# Patient Record
Sex: Female | Born: 1935 | Race: Black or African American | Hispanic: No | Smoking: Former smoker
Health system: Southern US, Community
[De-identification: ages and names within clinical notes are randomized; demographics above are authoritative.]

## PROBLEM LIST (undated history)

## (undated) DIAGNOSIS — C20 Malignant neoplasm of rectum: Secondary | ICD-10-CM

## (undated) DIAGNOSIS — I82409 Acute embolism and thrombosis of unspecified deep veins of unspecified lower extremity: Secondary | ICD-10-CM

## (undated) DIAGNOSIS — D069 Carcinoma in situ of cervix, unspecified: Secondary | ICD-10-CM

## (undated) DIAGNOSIS — M8580 Other specified disorders of bone density and structure, unspecified site: Secondary | ICD-10-CM

## (undated) DIAGNOSIS — I10 Essential (primary) hypertension: Secondary | ICD-10-CM

## (undated) DIAGNOSIS — F32A Depression, unspecified: Secondary | ICD-10-CM

## (undated) DIAGNOSIS — D219 Benign neoplasm of connective and other soft tissue, unspecified: Secondary | ICD-10-CM

## (undated) DIAGNOSIS — F329 Major depressive disorder, single episode, unspecified: Secondary | ICD-10-CM

## (undated) HISTORY — DX: Depression, unspecified: F32.A

## (undated) HISTORY — DX: Benign neoplasm of connective and other soft tissue, unspecified: D21.9

## (undated) HISTORY — PX: TUBAL LIGATION: SHX77

## (undated) HISTORY — PX: OTHER SURGICAL HISTORY: SHX169

## (undated) HISTORY — DX: Other specified disorders of bone density and structure, unspecified site: M85.80

## (undated) HISTORY — PX: ROTATOR CUFF REPAIR: SHX139

## (undated) HISTORY — DX: Carcinoma in situ of cervix, unspecified: D06.9

## (undated) HISTORY — DX: Acute embolism and thrombosis of unspecified deep veins of unspecified lower extremity: I82.409

## (undated) HISTORY — DX: Essential (primary) hypertension: I10

## (undated) HISTORY — PX: COLPOSCOPY: SHX161

## (undated) HISTORY — PX: GYNECOLOGIC CRYOSURGERY: SHX857

## (undated) HISTORY — DX: Major depressive disorder, single episode, unspecified: F32.9

## (undated) HISTORY — PX: VAGINAL HYSTERECTOMY: SUR661

## (undated) HISTORY — PX: CERVICAL CONE BIOPSY: SUR198

## (undated) HISTORY — DX: Malignant neoplasm of rectum: C20

---

## 1998-11-01 ENCOUNTER — Other Ambulatory Visit: Admission: RE | Admit: 1998-11-01 | Discharge: 1998-11-01 | Payer: Self-pay | Admitting: *Deleted

## 1999-10-16 HISTORY — PX: PELVIC LAPAROSCOPY: SHX162

## 1999-10-16 HISTORY — PX: OOPHORECTOMY: SHX86

## 2000-06-25 ENCOUNTER — Other Ambulatory Visit: Admission: RE | Admit: 2000-06-25 | Discharge: 2000-06-25 | Payer: Self-pay | Admitting: *Deleted

## 2000-08-21 ENCOUNTER — Encounter: Admission: RE | Admit: 2000-08-21 | Discharge: 2000-08-21 | Payer: Self-pay | Admitting: *Deleted

## 2000-08-21 ENCOUNTER — Encounter: Payer: Self-pay | Admitting: *Deleted

## 2000-10-04 ENCOUNTER — Encounter: Payer: Self-pay | Admitting: Obstetrics and Gynecology

## 2000-10-11 ENCOUNTER — Observation Stay (HOSPITAL_COMMUNITY): Admission: RE | Admit: 2000-10-11 | Discharge: 2000-10-12 | Payer: Self-pay | Admitting: Obstetrics and Gynecology

## 2000-10-11 ENCOUNTER — Encounter (INDEPENDENT_AMBULATORY_CARE_PROVIDER_SITE_OTHER): Payer: Self-pay | Admitting: Specialist

## 2001-06-05 ENCOUNTER — Ambulatory Visit (HOSPITAL_COMMUNITY): Admission: RE | Admit: 2001-06-05 | Discharge: 2001-06-05 | Payer: Self-pay | Admitting: *Deleted

## 2001-06-13 ENCOUNTER — Encounter: Payer: Self-pay | Admitting: Family Medicine

## 2001-06-13 ENCOUNTER — Ambulatory Visit (HOSPITAL_COMMUNITY): Admission: RE | Admit: 2001-06-13 | Discharge: 2001-06-13 | Payer: Self-pay | Admitting: Family Medicine

## 2001-06-13 ENCOUNTER — Inpatient Hospital Stay (HOSPITAL_COMMUNITY): Admission: EM | Admit: 2001-06-13 | Discharge: 2001-06-17 | Payer: Self-pay | Admitting: Family Medicine

## 2001-07-01 ENCOUNTER — Other Ambulatory Visit: Admission: RE | Admit: 2001-07-01 | Discharge: 2001-07-01 | Payer: Self-pay | Admitting: *Deleted

## 2002-02-11 ENCOUNTER — Ambulatory Visit (HOSPITAL_COMMUNITY): Admission: RE | Admit: 2002-02-11 | Discharge: 2002-02-11 | Payer: Self-pay | Admitting: *Deleted

## 2002-04-08 ENCOUNTER — Encounter (INDEPENDENT_AMBULATORY_CARE_PROVIDER_SITE_OTHER): Payer: Self-pay

## 2002-04-08 ENCOUNTER — Ambulatory Visit (HOSPITAL_COMMUNITY): Admission: RE | Admit: 2002-04-08 | Discharge: 2002-04-08 | Payer: Self-pay | Admitting: Gastroenterology

## 2002-05-07 ENCOUNTER — Ambulatory Visit (HOSPITAL_COMMUNITY): Admission: RE | Admit: 2002-05-07 | Discharge: 2002-05-07 | Payer: Self-pay | Admitting: Gastroenterology

## 2002-05-15 DIAGNOSIS — C20 Malignant neoplasm of rectum: Secondary | ICD-10-CM

## 2002-05-15 HISTORY — DX: Malignant neoplasm of rectum: C20

## 2002-05-21 ENCOUNTER — Encounter (INDEPENDENT_AMBULATORY_CARE_PROVIDER_SITE_OTHER): Payer: Self-pay

## 2002-05-21 ENCOUNTER — Encounter: Payer: Self-pay | Admitting: General Surgery

## 2002-05-21 ENCOUNTER — Ambulatory Visit (HOSPITAL_COMMUNITY): Admission: RE | Admit: 2002-05-21 | Discharge: 2002-05-21 | Payer: Self-pay | Admitting: Surgery

## 2002-05-25 ENCOUNTER — Ambulatory Visit (HOSPITAL_COMMUNITY): Admission: RE | Admit: 2002-05-25 | Discharge: 2002-05-25 | Payer: Self-pay | Admitting: General Surgery

## 2002-05-26 ENCOUNTER — Encounter: Payer: Self-pay | Admitting: Emergency Medicine

## 2002-05-26 ENCOUNTER — Emergency Department (HOSPITAL_COMMUNITY): Admission: EM | Admit: 2002-05-26 | Discharge: 2002-05-26 | Payer: Self-pay | Admitting: Emergency Medicine

## 2002-06-09 ENCOUNTER — Ambulatory Visit (HOSPITAL_COMMUNITY): Admission: RE | Admit: 2002-06-09 | Discharge: 2002-06-09 | Payer: Self-pay | Admitting: Orthopedic Surgery

## 2002-06-19 ENCOUNTER — Encounter: Payer: Self-pay | Admitting: General Surgery

## 2002-06-24 ENCOUNTER — Inpatient Hospital Stay (HOSPITAL_COMMUNITY): Admission: RE | Admit: 2002-06-24 | Discharge: 2002-06-25 | Payer: Self-pay | Admitting: General Surgery

## 2002-08-24 ENCOUNTER — Ambulatory Visit (HOSPITAL_COMMUNITY): Admission: RE | Admit: 2002-08-24 | Discharge: 2002-08-24 | Payer: Self-pay | Admitting: General Surgery

## 2002-08-25 ENCOUNTER — Encounter: Payer: Self-pay | Admitting: General Surgery

## 2002-08-25 ENCOUNTER — Ambulatory Visit (HOSPITAL_COMMUNITY): Admission: RE | Admit: 2002-08-25 | Discharge: 2002-08-25 | Payer: Self-pay | Admitting: General Surgery

## 2003-07-13 ENCOUNTER — Other Ambulatory Visit: Admission: RE | Admit: 2003-07-13 | Discharge: 2003-07-13 | Payer: Self-pay | Admitting: Obstetrics and Gynecology

## 2004-09-12 ENCOUNTER — Other Ambulatory Visit: Admission: RE | Admit: 2004-09-12 | Discharge: 2004-09-12 | Payer: Self-pay | Admitting: Obstetrics and Gynecology

## 2004-11-06 ENCOUNTER — Ambulatory Visit: Payer: Self-pay | Admitting: Hematology & Oncology

## 2005-05-04 ENCOUNTER — Ambulatory Visit: Payer: Self-pay | Admitting: Hematology & Oncology

## 2005-11-02 ENCOUNTER — Ambulatory Visit: Payer: Self-pay | Admitting: Hematology & Oncology

## 2006-02-15 ENCOUNTER — Other Ambulatory Visit: Admission: RE | Admit: 2006-02-15 | Discharge: 2006-02-15 | Payer: Self-pay | Admitting: Obstetrics and Gynecology

## 2006-05-01 ENCOUNTER — Ambulatory Visit: Payer: Self-pay | Admitting: Hematology & Oncology

## 2006-05-06 LAB — CBC WITH DIFFERENTIAL/PLATELET
BASO%: 0.6 % (ref 0.0–2.0)
Eosinophils Absolute: 0.2 10*3/uL (ref 0.0–0.5)
HCT: 36.3 % (ref 34.8–46.6)
LYMPH%: 32.5 % (ref 14.0–48.0)
MCHC: 33.3 g/dL (ref 32.0–36.0)
MCV: 81.7 fL (ref 81.0–101.0)
MONO#: 0.7 10*3/uL (ref 0.1–0.9)
MONO%: 14.7 % — ABNORMAL HIGH (ref 0.0–13.0)
NEUT%: 48.5 % (ref 39.6–76.8)
Platelets: 251 10*3/uL (ref 145–400)
WBC: 4.6 10*3/uL (ref 3.9–10.0)

## 2006-05-06 LAB — COMPREHENSIVE METABOLIC PANEL
Alkaline Phosphatase: 42 U/L (ref 39–117)
CO2: 31 mEq/L (ref 19–32)
Creatinine, Ser: 0.86 mg/dL (ref 0.40–1.20)
Glucose, Bld: 93 mg/dL (ref 70–99)
Total Bilirubin: 0.4 mg/dL (ref 0.3–1.2)

## 2006-05-09 LAB — 5 HIAA, QUANTITATIVE, URINE, 24 HOUR
5-HIAA, 24 Hr Urine: 1 mg/d (ref 0–15)
5-HIAA, Ur: 0.6 mg/L
Creatinine, Urine mg/day: 504 mg/d (ref 500–1400)
Creatinine, Urine-mg/dL-5HIAA: 21 mg/dL
Interpretation: NORMAL
Time-5HIAA: 24 h
Volume, Urine-5HIAA: 2400 mL

## 2006-10-30 ENCOUNTER — Ambulatory Visit: Payer: Self-pay | Admitting: Hematology & Oncology

## 2006-11-04 LAB — CBC WITH DIFFERENTIAL/PLATELET
BASO%: 0.5 % (ref 0.0–2.0)
Basophils Absolute: 0 10*3/uL (ref 0.0–0.1)
EOS%: 1 % (ref 0.0–7.0)
HGB: 11.8 g/dL (ref 11.6–15.9)
MCH: 26.6 pg (ref 26.0–34.0)
MCHC: 32.3 g/dL (ref 32.0–36.0)
MCV: 82.4 fL (ref 81.0–101.0)
MONO%: 12.8 % (ref 0.0–13.0)
RBC: 4.42 10*6/uL (ref 3.70–5.32)
RDW: 15.8 % — ABNORMAL HIGH (ref 11.3–14.5)
lymph#: 1.8 10*3/uL (ref 0.9–3.3)

## 2006-11-04 LAB — COMPREHENSIVE METABOLIC PANEL
ALT: 10 U/L (ref 0–35)
AST: 16 U/L (ref 0–37)
Albumin: 4.1 g/dL (ref 3.5–5.2)
Alkaline Phosphatase: 40 U/L (ref 39–117)
BUN: 15 mg/dL (ref 6–23)
Chloride: 100 mEq/L (ref 96–112)
Potassium: 3.3 mEq/L — ABNORMAL LOW (ref 3.5–5.3)

## 2007-04-30 ENCOUNTER — Ambulatory Visit: Payer: Self-pay | Admitting: Hematology & Oncology

## 2007-05-05 LAB — CBC WITH DIFFERENTIAL/PLATELET
Eosinophils Absolute: 0.1 10*3/uL (ref 0.0–0.5)
HCT: 33.6 % — ABNORMAL LOW (ref 34.8–46.6)
LYMPH%: 26.1 % (ref 14.0–48.0)
MCHC: 33.6 g/dL (ref 32.0–36.0)
MCV: 80.3 fL — ABNORMAL LOW (ref 81.0–101.0)
MONO#: 0.5 10*3/uL (ref 0.1–0.9)
MONO%: 11.7 % (ref 0.0–13.0)
NEUT#: 2.6 10*3/uL (ref 1.5–6.5)
NEUT%: 60.1 % (ref 39.6–76.8)
Platelets: 248 10*3/uL (ref 145–400)
WBC: 4.3 10*3/uL (ref 3.9–10.0)

## 2007-05-05 LAB — COMPREHENSIVE METABOLIC PANEL
BUN: 13 mg/dL (ref 6–23)
CO2: 27 mEq/L (ref 19–32)
Creatinine, Ser: 0.79 mg/dL (ref 0.40–1.20)
Glucose, Bld: 85 mg/dL (ref 70–99)
Total Bilirubin: 0.3 mg/dL (ref 0.3–1.2)

## 2007-05-07 LAB — 5 HIAA, QUANTITATIVE, URINE, 24 HOUR: 5-HIAA, 24 Hr Urine: 3.3 mg/24 h (ref <=6.0)

## 2007-10-31 ENCOUNTER — Ambulatory Visit: Payer: Self-pay | Admitting: Hematology & Oncology

## 2007-12-16 ENCOUNTER — Encounter (INDEPENDENT_AMBULATORY_CARE_PROVIDER_SITE_OTHER): Payer: Self-pay | Admitting: Orthopedic Surgery

## 2007-12-16 ENCOUNTER — Ambulatory Visit (HOSPITAL_BASED_OUTPATIENT_CLINIC_OR_DEPARTMENT_OTHER): Admission: RE | Admit: 2007-12-16 | Discharge: 2007-12-16 | Payer: Self-pay | Admitting: Orthopedic Surgery

## 2008-02-20 ENCOUNTER — Ambulatory Visit: Payer: Self-pay | Admitting: Hematology & Oncology

## 2008-02-24 LAB — COMPREHENSIVE METABOLIC PANEL
ALT: 15 U/L (ref 0–35)
AST: 16 U/L (ref 0–37)
CO2: 30 mEq/L (ref 19–32)
Calcium: 9.7 mg/dL (ref 8.4–10.5)
Chloride: 101 mEq/L (ref 96–112)
Creatinine, Ser: 0.83 mg/dL (ref 0.40–1.20)
Potassium: 3.4 mEq/L — ABNORMAL LOW (ref 3.5–5.3)
Sodium: 141 mEq/L (ref 135–145)
Total Protein: 7 g/dL (ref 6.0–8.3)

## 2008-02-24 LAB — CBC WITH DIFFERENTIAL/PLATELET
BASO%: 0.6 % (ref 0.0–2.0)
EOS%: 3 % (ref 0.0–7.0)
HCT: 35.3 % (ref 34.8–46.6)
MCH: 27.5 pg (ref 26.0–34.0)
MCHC: 33.9 g/dL (ref 32.0–36.0)
MONO#: 0.6 10*3/uL (ref 0.1–0.9)
NEUT%: 50.1 % (ref 39.6–76.8)
RBC: 4.35 10*6/uL (ref 3.70–5.32)
RDW: 15.4 % — ABNORMAL HIGH (ref 11.3–14.5)
WBC: 4.5 10*3/uL (ref 3.9–10.0)
lymph#: 1.5 10*3/uL (ref 0.9–3.3)

## 2008-02-24 LAB — FERRITIN: Ferritin: 55 ng/mL (ref 10–291)

## 2008-08-25 ENCOUNTER — Ambulatory Visit: Payer: Self-pay | Admitting: Hematology & Oncology

## 2008-09-01 ENCOUNTER — Ambulatory Visit: Payer: Self-pay | Admitting: Obstetrics and Gynecology

## 2008-09-01 ENCOUNTER — Other Ambulatory Visit: Admission: RE | Admit: 2008-09-01 | Discharge: 2008-09-01 | Payer: Self-pay | Admitting: Obstetrics and Gynecology

## 2008-09-01 ENCOUNTER — Encounter: Payer: Self-pay | Admitting: Obstetrics and Gynecology

## 2008-10-04 ENCOUNTER — Ambulatory Visit: Payer: Self-pay | Admitting: Obstetrics and Gynecology

## 2008-12-10 ENCOUNTER — Ambulatory Visit: Payer: Self-pay | Admitting: Hematology & Oncology

## 2008-12-14 ENCOUNTER — Ambulatory Visit (HOSPITAL_COMMUNITY): Admission: RE | Admit: 2008-12-14 | Discharge: 2008-12-14 | Payer: Self-pay | Admitting: Oncology

## 2008-12-14 LAB — CBC WITH DIFFERENTIAL/PLATELET
BASO%: 0.5 % (ref 0.0–2.0)
EOS%: 1.2 % (ref 0.0–7.0)
HCT: 36.7 % (ref 34.8–46.6)
HGB: 12.4 g/dL (ref 11.6–15.9)
MCHC: 33.8 g/dL (ref 31.5–36.0)
MONO#: 0.7 10*3/uL (ref 0.1–0.9)
NEUT%: 55.9 % (ref 38.4–76.8)
RDW: 16 % — ABNORMAL HIGH (ref 11.2–14.5)
WBC: 5.6 10*3/uL (ref 3.9–10.3)
lymph#: 1.6 10*3/uL (ref 0.9–3.3)

## 2008-12-14 LAB — COMPREHENSIVE METABOLIC PANEL
ALT: 14 U/L (ref 0–35)
AST: 18 U/L (ref 0–37)
Albumin: 3.7 g/dL (ref 3.5–5.2)
CO2: 32 mEq/L (ref 19–32)
Calcium: 9.5 mg/dL (ref 8.4–10.5)
Chloride: 96 mEq/L (ref 96–112)
Creatinine, Ser: 0.85 mg/dL (ref 0.40–1.20)
Potassium: 3 mEq/L — ABNORMAL LOW (ref 3.5–5.3)
Total Protein: 7.3 g/dL (ref 6.0–8.3)

## 2009-01-18 LAB — 5 HIAA, QUANTITATIVE, URINE, 24 HOUR
5-HIAA, 24 Hr Urine: 2.4 mg/24 h (ref <=6.0)
Volume, Urine-5HIAA: 2700 mL/24 h

## 2009-06-17 ENCOUNTER — Ambulatory Visit: Payer: Self-pay | Admitting: Oncology

## 2009-06-22 LAB — COMPREHENSIVE METABOLIC PANEL
Alkaline Phosphatase: 51 U/L (ref 39–117)
BUN: 17 mg/dL (ref 6–23)
CO2: 30 mEq/L (ref 19–32)
Glucose, Bld: 85 mg/dL (ref 70–99)
Total Bilirubin: 0.4 mg/dL (ref 0.3–1.2)

## 2009-06-22 LAB — CBC WITH DIFFERENTIAL/PLATELET
Basophils Absolute: 0 10*3/uL (ref 0.0–0.1)
Eosinophils Absolute: 0.1 10*3/uL (ref 0.0–0.5)
HCT: 35.8 % (ref 34.8–46.6)
HGB: 12 g/dL (ref 11.6–15.9)
LYMPH%: 32 % (ref 14.0–49.7)
MCV: 82.7 fL (ref 79.5–101.0)
MONO%: 15.4 % — ABNORMAL HIGH (ref 0.0–14.0)
NEUT#: 2.6 10*3/uL (ref 1.5–6.5)
NEUT%: 49.9 % (ref 38.4–76.8)
Platelets: 243 10*3/uL (ref 145–400)

## 2009-06-24 ENCOUNTER — Ambulatory Visit (HOSPITAL_COMMUNITY): Admission: RE | Admit: 2009-06-24 | Discharge: 2009-06-24 | Payer: Self-pay | Admitting: Oncology

## 2009-10-25 ENCOUNTER — Ambulatory Visit: Payer: Self-pay | Admitting: Oncology

## 2010-02-22 ENCOUNTER — Ambulatory Visit: Payer: Self-pay | Admitting: Oncology

## 2010-02-23 LAB — COMPREHENSIVE METABOLIC PANEL
AST: 22 U/L (ref 0–37)
Albumin: 4.2 g/dL (ref 3.5–5.2)
BUN: 17 mg/dL (ref 6–23)
CO2: 30 mEq/L (ref 19–32)
Calcium: 9.6 mg/dL (ref 8.4–10.5)
Chloride: 98 mEq/L (ref 96–112)
Creatinine, Ser: 0.87 mg/dL (ref 0.40–1.20)
Potassium: 4.6 mEq/L (ref 3.5–5.3)

## 2010-02-23 LAB — CBC WITH DIFFERENTIAL/PLATELET
Basophils Absolute: 0 10*3/uL (ref 0.0–0.1)
EOS%: 2.5 % (ref 0.0–7.0)
Eosinophils Absolute: 0.1 10*3/uL (ref 0.0–0.5)
HCT: 35.3 % (ref 34.8–46.6)
HGB: 11.6 g/dL (ref 11.6–15.9)
MCH: 26.9 pg (ref 25.1–34.0)
MONO#: 0.7 10*3/uL (ref 0.1–0.9)
NEUT#: 2.3 10*3/uL (ref 1.5–6.5)
NEUT%: 44.7 % (ref 38.4–76.8)
RDW: 15.4 % — ABNORMAL HIGH (ref 11.2–14.5)
WBC: 5.1 10*3/uL (ref 3.9–10.3)
lymph#: 2 10*3/uL (ref 0.9–3.3)

## 2010-05-10 ENCOUNTER — Other Ambulatory Visit: Admission: RE | Admit: 2010-05-10 | Discharge: 2010-05-10 | Payer: Self-pay | Admitting: Obstetrics and Gynecology

## 2010-05-10 ENCOUNTER — Ambulatory Visit: Payer: Self-pay | Admitting: Obstetrics and Gynecology

## 2010-06-13 ENCOUNTER — Ambulatory Visit: Payer: Self-pay | Admitting: Oncology

## 2010-06-15 ENCOUNTER — Ambulatory Visit (HOSPITAL_COMMUNITY): Admission: RE | Admit: 2010-06-15 | Discharge: 2010-06-15 | Payer: Self-pay | Admitting: Oncology

## 2010-06-15 LAB — BASIC METABOLIC PANEL
CO2: 32 mEq/L (ref 19–32)
Calcium: 8.9 mg/dL (ref 8.4–10.5)
Creatinine, Ser: 0.85 mg/dL (ref 0.40–1.20)
Sodium: 137 mEq/L (ref 135–145)

## 2010-08-17 ENCOUNTER — Ambulatory Visit: Payer: Self-pay | Admitting: Oncology

## 2010-09-04 LAB — COMPREHENSIVE METABOLIC PANEL
Alkaline Phosphatase: 48 U/L (ref 39–117)
BUN: 18 mg/dL (ref 6–23)
CO2: 32 mEq/L (ref 19–32)
Chloride: 100 mEq/L (ref 96–112)
Creatinine, Ser: 0.83 mg/dL (ref 0.40–1.20)
Glucose, Bld: 102 mg/dL — ABNORMAL HIGH (ref 70–99)
Potassium: 3.2 mEq/L — ABNORMAL LOW (ref 3.5–5.3)
Total Bilirubin: 0.5 mg/dL (ref 0.3–1.2)
Total Protein: 7 g/dL (ref 6.0–8.3)

## 2010-09-04 LAB — CBC WITH DIFFERENTIAL/PLATELET
BASO%: 0.6 % (ref 0.0–2.0)
Basophils Absolute: 0 10*3/uL (ref 0.0–0.1)
EOS%: 1.4 % (ref 0.0–7.0)
HCT: 35.4 % (ref 34.8–46.6)
HGB: 12 g/dL (ref 11.6–15.9)
MCH: 28.1 pg (ref 25.1–34.0)
MCHC: 33.8 g/dL (ref 31.5–36.0)
MONO#: 0.7 10*3/uL (ref 0.1–0.9)
NEUT%: 52.5 % (ref 38.4–76.8)
RDW: 15.3 % — ABNORMAL HIGH (ref 11.2–14.5)
WBC: 4.6 10*3/uL (ref 3.9–10.3)
lymph#: 1.4 10*3/uL (ref 0.9–3.3)

## 2010-12-28 ENCOUNTER — Other Ambulatory Visit: Payer: Self-pay | Admitting: Gastroenterology

## 2011-01-23 ENCOUNTER — Encounter (INDEPENDENT_AMBULATORY_CARE_PROVIDER_SITE_OTHER): Payer: Medicare Other

## 2011-01-23 DIAGNOSIS — M899 Disorder of bone, unspecified: Secondary | ICD-10-CM

## 2011-02-27 NOTE — Op Note (Signed)
NAMEPERRY, Jade Jenkins                  ACCOUNT NO.:  [id number]   MEDICAL RECORD NO.:  [id number]          PATIENT TYPE:  AMB   LOCATION:  DSC                          FACILITY:  [facility]   PHYSICIAN:  Katy Fitch. Sypher, M.D. DATE OF BIRTH:  Apr 30, 1931   DATE OF PROCEDURE:  DATE OF DISCHARGE:                               OPERATIVE REPORT   PREOPERATIVE DIAGNOSES:  1. Large subcutaneous lipoma extending into deltoid muscle, left      anterior shoulder.  2. Chronic left shoulder pain with MRI evidence of acromioclavicular      degenerative arthritis and tendinopathy of rotator cuff without      evidence of retracted full-thickness rotator cuff tear.   POSTOPERATIVE DIAGNOSES:  1. Large anterior subcutaneous lipoma with infiltration into deltoid      muscle.  2. Acromioclavicular degenerative arthritis.  3. Partial-thickness rotator cuff degenerative tear and limited labral      degenerative tear, left shoulder.   OPERATION:  1. Resection of 6 cm diameter lipoma from anterior subcutaneous      shoulder at anterior inferior deltoid with slight infiltration into      the anterior fibers of deltoid.  2. Arthroscopic debridement of left glenohumeral joint including      labrum and deep surface rotator cuff.  3. Arthroscopic subacromial decompression, left shoulder.  4. Arthroscopic resection, distal clavicle.   OPERATIONS:  Jade Igo, MD.   ASSISTANT:  Annye Rusk, PA-C.   ANESTHESIA:  General by endotracheal technique supplemented by a left  interscalene block.  Supervising anesthesiologist is Dr. Jacklynn Bue.   INDICATIONS:  Jade Jenkins is a 77 year old woman referred through the  courtesy of Dr. Tacy Learn, retired neurologist.  Her primary care  physician is Dr. Renford Dills [facility].  She has a history  of a large lipoma at her left anterior shoulder and decided to seek  excision of her lipoma.  She reported a history of increasing shoulder  pain aggravated by  motion in certain postures and, at times, pain at  night.  Her clinical examination suggested some rotator cuff impairment  and AC arthropathy.  Plain x-rays confirm some AC degenerative change  with a prominent medial acromion.  An MRI of the shoulder was  recommended to delineate her degree of infiltration of the lipoma prior  to planning a surgical excision.  The incidental finding on the MRI was  that she had significant tendinopathy of the rotator cuff and AC  arthropathy.  There was no evidence of a retracted rotator cuff tear.   We advised [name] when scheduling her lipoma excision to also include  an arthroscopic evaluation of her shoulder so that we could address her  AC arthropathy and decompress the rotator cuff.  She had a very  prominent anterolateral acromial spur that appeared to be the genesis of  her tendinopathy.   After lengthy informed consent in the office, she is brought to the  operating room at this time.   PROCEDURE:  Jade Jenkins is brought to the operating room and placed in  supine position  on the operating table.   Following an anesthesia consult with Dr. Jacklynn Bue, general anesthesia  with a supplemental interscalene block was recommended and accepted.   The interscalene block was placed in the holding area without  complication, followed by transfer to room 6 of the day surgery center  and positioning in the supine position upon the operating table.   Dr. Jacklynn Bue was present while general endotracheal anesthesia was  induced, followed by careful positioning in the beach-chair position  with the aid of a torso and head holder designed for shoulder  arthroscopy.  The entire left upper extremity was prepped with DuraPrep  and draped with impervious arthroscopy drapes.  We initially resected  the lipoma due to the concern about distention of the tissues with  saline following arthroscopy.   The lipoma was delineated and a 4-cm anterior incision  fashioned.   The lipoma was very well differentiated and did not have an easily  identifiable pseudo capsule.  With great care, relying primarily on  palpation, comparing the density of normal fat with a lipoma, we excised  a multilobular mass that measured somewhere between 6 and 7 cm in length  and width and approximately 3 cm in depth.  The posterior inferior  aspect of this lipoma did infiltrate the deltoid musculature and was  removed with a small amount of muscle fibers.   The margins of the cavity created by lipoma excision were then carefully  tailored with a rongeur, trying to minimize the appearance of a dead  space following resection of the lipoma.   All of the resected adipose tissue was placed in formalin and sent for  pathologic evaluation.   After hemostasis achieved, the wound was repaired with subdermal sutures  of 4-0 Vicryl and intradermal 3-0 Prolene with Steri-Strips.   Attention directed to was then directed to Jade Jenkins' shoulder.  The  scope was placed through a standard posterior viewing portal.  Diagnostic arthroscopy revealed some degenerative change of the labrum  superiorly and some degenerative change of the rotator cuff with  fragments of cuff hanging within the joint.  An anterior portal was  created under direct vision, followed by limited synovectomy,  debridement of the labrum and debridement of the free hanging fragments  of cuff.  A rather unusual veil of tissue surrounded the biceps tendon  in a hammock fashion and, while it did not appear to be directly  impinging on the biceps tendon, we ultimately elected to debride this  out of concern that this could be a source of some of her discomfort.   This was debrided with the electrocautery and suction shaver.  After  completion of the glenohumeral debridement, the scope was removed and  placed in the subacromial space.   A florid bursitis was identified.  This was resected with a suction   shaver, followed by use of the cutting cautery to obtain hemostasis and  partially relax the coracoacromial ligament.  The large anterior lateral  acromial spur was easily visualized.  Subsequently this was cleared of  soft tissues with the suction shaver and leveled to a type 1 morphology  with the arthroscopic suction bur.  The [facility] joint capsule had been  violated due to abrasion and after hemostasis of the adjacent vessels,  the distal 15 mm of clavicle was removed arthroscopically with the  suction bur.   The cuff was carefully inspected and no evidence of a full-thickness  tear could be identified.   After debridement and  hemostasis, the arthroscopic equipment was retired  and the portals repaired with mattress suture of 3-0 Prolene.   The wounds were then dressed with sterile gauze, sterile ABD pads and  sterile paper tape.  There were no apparent complications.      Katy Fitch Sypher, M.D.  Electronically Signed     RVS/MEDQ  D:  12/16/2007  T:  12/16/2007  Job:  [id number]   cc:   Deirdre Peer. Polite, M.D.

## 2011-03-02 NOTE — Discharge Summary (Signed)
   NAME:  Jade Jenkins, Jade Jenkins                            ACCOUNT NO.:  [id number]   MEDICAL RECORD NO.:  [id number]                   PATIENT TYPE:  OUT   LOCATION:  PADM                                 FACILITY:  [facility]   PHYSICIAN:  Adolph Pollack, M.D.            DATE OF BIRTH:  [birth year]   DATE OF ADMISSION:  06/23/2002  DATE OF DISCHARGE:  06/25/2002                                 DISCHARGE SUMMARY   PRINCIPAL DISCHARGE DIAGNOSIS:  Carcinoid tumor of the rectum.   SECONDARY DIAGNOSES:  1. Hypertension.  2. Gastroesophageal reflux disease.  3. Hypercholesterolemia.   PROCEDURE:  Transanal excision of carcinoid tumor of the rectum.   REASON FOR ADMISSION:  The patient is a 77 year old female who underwent a  screening colonoscopy by Dr. Bernette Redbird.  A mass 7 cm from the anal  verge was found, biopsied, and consistent with carcinoma.  Endoscopic  ultrasound was performed, and demonstrated tumor to be superficial.  She was  admitted for elective transanal excision.  Her operation had to be delayed  because she developed pneumonia in the interim.   HOSPITAL COURSE:  She underwent the above procedure.  Postoperatively, she  had some nausea in the first postoperative day, and some urinary retention,  and had to have a Foley placed.  However, by the second postoperative day,  we had removed her Foley, she was voiding, nausea resolved, bowels were  moving, she was much improved and she was ready for discharge.   DISPOSITION:  Discharged home in satisfactory condition on 06/25/02.   DISCHARGE MEDICATIONS:  1. She was told to continue her home medications.  2. Tylox for pain.  3. She was also told to take a stool softener b.i.d.   DIET:  She was to be maintained on a liquid diet for five days, and resume a  normal diet.   ACTIVITY:  Activity restrictions were given.    FOLLOWUP:  She will call for follow up appointment in one to two weeks.  She  is also told to call if she  has any problems.                                                Adolph Pollack, M.D.    Kari Baars  D:  07/14/2002  T:  07/14/2002  Job:  [id number]   cc:   Florencia Reasons, M.D.  [address], [address]  [place], [place] [address]  Fax: [phone]   Talmadge Coventry, M.D.

## 2011-03-02 NOTE — H&P (Signed)
   Jade Jenkins, Jade Jenkins NO.:  [id number]   MEDICAL RECORD NO.:  [id number]                   PATIENT TYPE:   LOCATION:                                       FACILITY:   PHYSICIAN:  Adolph Pollack, M.D.            DATE OF BIRTH:   DATE OF ADMISSION:  06/23/2002  DATE OF DISCHARGE:                                HISTORY & PHYSICAL   REASON FOR ADMISSION:  Elective transanal excision of a carcinoid tumor of  rectum.   HISTORY OF PRESENT ILLNESS:  The patient is a 77 year old female who  underwent a screening colonoscopy by Dr. Bernette Redbird in late June.  A  mass was noted at 7 cm from antevert, biopsied and was positive for  carcinoid.  It was felt that the mass may be about 3 cm in size.  An  endoscopic ultrasound by Dr. Russella Dar was performed.  This demonstrated smooth  margins to the mass.  She is admitted now for elective transanal excision.  Her operation has been delayed secondary to pneumonia.   PAST MEDICAL HISTORY:  1. Pneumonia.  2. Hypertension.  3. Gastroesophageal reflux disease.  4. Left lower extremity deep venous thrombosis.  5. Hypercholesterolemia.   PAST SURGICAL HISTORY:  1. Hysterectomy.  2. Appendectomy.  3. Tubal ligation.   ALLERGIES:  None.   MEDICATIONS:  1. Atenolol 100 mg q.h.s.  2. Maxzide one a day.  3. Tylenol p.r.n.   SOCIAL HISTORY:  She used to smoke cigarettes but has now quit.  No tobacco  use.   FAMILY HISTORY:  Positive for heart disease and diabetes.   PHYSICAL EXAMINATION:  GENERAL:  A well-developed, well-nourished female in  no acute distress.  Pleasant, cooperative.  CARDIOVASCULAR:  Regular rate and rhythm, no murmur.  RESPIRATORY:  Breath sounds are equal and clear.  Respirations unlabored.  ABDOMEN:  Soft, nontender, nondistended.  No palpable masses.  Well-healed  low midline scar.  RECTAL:  External skin tags are present.  There is a palpable mass about 7  cm up in the anal verge  that I can palpate at the tip of my finger in its  posterior position.  EXTREMITIES:  No cyanosis or edema.    IMPRESSION:  Rectal carcinoid.  Twenty-four-hour urine for 5-HIAA negative.   PLAN:  Transanal excision of rectal carcinoid.  The procedure and risks were  explained to her preoperatively.                                                 Adolph Pollack, M.D.    Kari Baars  D:  06/23/2002  T:  06/23/2002  Job:  [id number]

## 2011-03-02 NOTE — H&P (Signed)
[facility]  Patient:    Jade Jenkins, Jade Jenkins                           MRN: [id number] Attending:  Rande Brunt. Eda Paschal, M.D.                         History and Physical  CHIEF COMPLAINT:  Enlarged right ovary.  HISTORY OF PRESENT ILLNESS:  The patient is a 77 year old, gravida 4, para 3, AB 1 who came to see me last summer because during a workup for urticaria by Heather Roberts, M.D. a right adnexal mass was found on ultrasound. In reviewing the ultrasound, it appeared that either she had an enlarged right ovary without any obvious ovarian neoplasm or a right hydrosalpinx. Although the ovary was slightly larger than the left and was slightly larger than a normal postmenopausal ovary when you looked at volumes of ovaries with standard deviations, it fell within normal limits. It was elected at that point just to watch it. She came back and had a follow-up ultrasound in April of this year which showed stability. There was really no evidence of a hydrosalpinx. Her right ovary was still larger than the left but was still within 1 standard deviation within normal for a postmenopausal ovary. She then returned again in December of this year for further scan. At this time, the right ovary has increased even more. What is even more disturbing is that with the increase in size of the right ovary, the left ovary has actually gotten smaller which obviously is what it should be doing in a postmenopausal period. The volume of the right ovary is now 4.6 with the left ovary decreased from 2.9 to 1.1. These are volume measurements. As a result of this, a phone consultation was held with Reuel Boom L. Clarke-Pearson, M.D. He really felt that the ovary should be removed at this point to be sure she did not have any occult pathology. Once again, on ultrasound, there is no change in the echo pattern and no obvious neoplasm can be found, but there is a clear difference in volume that  makes this disturbing. She now enters the hospital for laparoscopic bilateral salpingo-oophorectomy. She does understand that it may not be possible to do the surgery laparoscopically and that she may end up with a laparotomy.  PAST MEDICAL HISTORY:  Vaginal hysterectomy done previously for CIN.  PRESENT MEDICATIONS:  ALLERGIES:  None.  FAMILY HISTORY:  Noncontributory.  SOCIAL HISTORY:  The patient is a nonsmoker, nondrinker.  PHYSICAL EXAMINATION:  GENERAL:  The patient is a well-developed, well-nourished female in no acute distress.  VITAL SIGNS:  Blood pressure is 150/90, pulse is 80 and regular, respirations 16 and nonlabored, she is afebrile.  HEENT:  Within normal limits.  NECK:  Supple. Trachea in the midline. Thyroid is not enlarged.  LUNGS:  Clear to P&A.  HEART:  No thrills, heaves, or murmurs.  BREASTS:  No masses.  ABDOMEN:  Soft without guarding, rebound, or masses.  PELVIC:  External and vagina is within normal limits. Pap smear shows no atypia. Bimanual and rectal fail to reveal any masses.  EXTREMITIES:  Within normal limits.  ADMISSION IMPRESSION:  Enlarged right ovary without obvious neoplastic lesion.  PLAN:  Diagnostic laparoscopy with BSO, possible laparotomy. DD:  10/11/00 TD:  10/11/00 Job: [id number]

## 2011-03-02 NOTE — Op Note (Signed)
TNAMEMISHAAL, LANSDALE NO.:  [id number]   MEDICAL RECORD NO.:  [id number]                   PATIENT TYPE:   LOCATION:                                       FACILITY:   PHYSICIAN:  Adolph Pollack, M.D.            DATE OF BIRTH:   DATE OF PROCEDURE:  DATE OF DISCHARGE:                                 OPERATIVE REPORT   PREOPERATIVE DIAGNOSIS:  Carcinoid tumor of the rectum.   POSTOPERATIVE DIAGNOSIS:  Carcinoid tumor of the rectum.   PROCEDURE:  Transanal excision of carcinoid tumor of the rectum.   SURGEON:  Adolph Pollack, M.D.   ANESTHESIA:  General.   INDICATION:  The patient is a 77 year old female, who underwent a screening  colonoscopy, and the mass at 7 cm from the anal verge was found, biopsied,  and consistent with carcinoid.  She underwent a transrectal ultrasound which  demonstrated this mass to have smooth margins.  It is unknown whether this  is a benign or malignant-behaving tumor, and it was described as being about  3 cm in size.  On digital rectal exam, it is palpable posteriorly.  She now  presents for elective transanal excision.  Her operation had to be delayed,  as she had taken pneumonia recently.   TECHNIQUE:  She was placed supine on the operating table, and a general  anesthetic was administered.  She was then placed in the lithotomy position.  The perianal area was sterilely prepped and draped.  Digital rectal exam was  performed, and the tumor was noted to be palpable posteriorly.  Using a  long, lighted proctoscope, I exposed the tumor.  I then put stay sutures to  the right and the left of it.  I also put a stay suture proximal to it.  Using the cautery, I did full-thickness excision with tumor, trying to have  1 cm margins around it.  While excising the tumor, I noted that the margins  were close in the proximal right and left positions, so I took separate 1 cm  margins in the proximal right and left  position and sent them in separate  sample containers.   Next, I controlled some bleeding with cautery.  I then primarily closed the  wound with interrupted 2-0 Vicryl sutures.  After closure, I performed a  rigid proctosigmoidoscopy and went beyond the area without difficulty and  then examined suture line which was hemostatic.  I then placed a piece of  Gelfoam on the suture line.  A sterile dressing was then applied over the  anal area.   The tumor measured approximately 1 cm when it was removed.  She subsequently  was placed back in the supine position, extubated, and taken to the recovery  room in satisfactory condition.  There were no apparent complications.  Adolph Pollack, M.D.    Kari Baars  D:  06/23/2002  T:  06/23/2002  Job:  [id number]   cc:   Florencia Reasons, M.D.  [address], [address]  [place], [place] [address]  Fax: [phone]   Talmadge Coventry, M.D.

## 2011-03-02 NOTE — Discharge Summary (Signed)
[facility]  Patient:    EMELIN, DASCENZO                         MRN: [id number] Adm. Date:  [id number] Disc. Date: 10/12/00 Attending:  Sharon Mt                           Discharge Summary  HISTORY OF PRESENT ILLNESS:  Mrs. Kibby is a 77 year old gravida 4, para 3, AB 1 who was admitted to the hospital for surgery because of enlarged right ovary in the postmenopausal period.  HOSPITAL COURSE:  She was taken to the operating room.  Diagnostic laparoscopy was performed.  The right ovary was indeed much larger than the left and also larger than a normal postmenopausal ovary; however, no actual pathology could be seen on it.  She underwent laparoscopic bilateral salpingo-oophorectomy without problems.  She was kept for 23-hour observation and, by the morning of December 29, she was ready for discharge.  DISCHARGE ACTIVITY:  Regular.  DISCHARGE DIET:  Soft.  DISCHARGE MEDICATIONS:  Tylox.  FOLLOW-UP:  She will return to the office in two weeks for further follow-up. Pathology report is not available at time of this dictation.  DISCHARGE DIAGNOSIS:  Enlarged right postmenopausal ovary.  OPERATION:  Diagnostic laparoscopy with bilateral salpingo-oophorectomy.DD: 10/12/00 TD:  10/12/00 Job: [id number]

## 2011-03-02 NOTE — Discharge Summary (Signed)
[facility]  Patient:    HALENA, MOHAR Visit Number: [id number] MRN: [id number]          Service Type: Attending:  Heather Roberts, M.D. Dictated by:   Heather Roberts, M.D. Adm. Date:  06/13/01 Disc. Date: 06/17/01                             Discharge Summary  DISCHARGE DIAGNOSES: 1. Deep venous thrombosis, left leg. 2. Hypertension. 3. Esophageal reflux disease. 4. Constipation resolving.  HISTORY OF PRESENT ILLNESS:  Ms. Tison is a 77 year old black married female with hypertension, former smoker, who two weeks prior to admission awakened from a nap after church with a cramp in her leg. Seventy-two hours later she saw her physician who ordered a Doppler which was read as negative. She had persistent swelling and pain in her left leg and was reevaluated on August 30 with a positive Doppler and admitted for further evaluation and treatment.  For PMH, social history, physical exam, see admission history and physical.  LABORATORY DATA:  INR at discharge was 1.8, hemoglobin 11, sed rate 53, CA-125 and CA-19 pending. TSH 4.5. Electrolytes normal. Glucose 122. ASO less than 20. Rheumatoid factor less than 20. CRP-3 0.5, minimally elevated.  RADIOGRAPHIC DATA:  Chest x-ray PA and lateral negative.  HOSPITAL COURSE:   Ms. Stierwalt was admitted for heparinization and conversion to Coumadin. Within 24 hours her left leg was more comfortable but she persisted to have an intermittent discomfort throughout the hospitalization. Because of her diet, she required relatively high doses of Coumadin and was discharged in satisfactory condition on June 17, 2001 on Coumadin 10 mg with an INR of 1.8 for repeat PT 72 hours after discharge.  OTHER MEDICATIONS AT DISCHARGE:  Norvasc 10, Protonix 20. Dictated by:   Heather Roberts, M.D. Attending:  Heather Roberts, M.D. DD:  06/17/01 TD:  06/17/01 Job: [id number]

## 2011-03-02 NOTE — Op Note (Signed)
[facility]  Patient:    Jade Jenkins, Jade Jenkins                         MRN: [id number] Proc. Date: 10/11/00 Adm. Date:  [id number] Attending:  Sharon Mt                           Operative Report  PREOPERATIVE DIAGNOSIS:  Persistently enlarged right ovary.  POSTOPERATIVE DIAGNOSIS:  Persistently enlarged right ovary.  OPERATION:  Diagnostic laparoscopy with bilateral salpingo-oophorectomy.  SURGEON:  Daniel L. Eda Paschal, M.D.  FIRST ASSISTANT:  Juan H. Lily Peer, M.D.  INDICATIONS:  The patient is a 77 year old female who has been watched on ultrasound because of a right ovary that has remained twice as large as the left ovary.  However, on her last ultrasound, the right ovary actually had increased in size so that it was over 4 cm and was four times larger than the left ovary.  Although the echo pattern remained normal, it was significantly enlarged.  Phone consultation with Dr. De Blanch was obtained, and he felt that in spite of the absence of an obvious neoplasm, she should have a laparoscopic BSO to be sure she did not have a solid tumor enlarging the ovary.  FINDINGS:  At the time of laparoscopy, the patients right ovary was greater in size than the left ovary, although it appeared to only be about twice as big.  It clearly looked larger than a postmenopausal ovary should look in a 77 year old woman.  Other than this, no pathology was really seen on either side.  There were no adhesions from her previous vaginal hysterectomy.  DESCRIPTION OF PROCEDURE:  After adequate general endotracheal anesthesia, the patient was placed in the dorsolithotomy position, prepped and draped in the usual sterile manner.  A Foley catheter was inserted into the bladder and a sponge stick was placed in the vagina to identify the vaginal cuff.  A pneumoperitoneum was created with a Veress needle using 3.5 L of carbon dioxide.  This was done  subumbilically.  This incision was extended and through that, a 12 mm trocar was placed.  Through that, a laparoscope was placed which was attached to a camera.  Two 5 mm ports were placed in the pelvis, one on the right side and one on the left side.  The pelvis was inspected.  The above findings were noted.  Peritoneal washings were obtained. The right adnexa was elevated.  The ureter could be clearly seen.  The infundibulopelvic ligament was bipolar cut.  The rest of the attachments of the adnexa to the top of the cuff and to the broad ligament were bipolar cut, being careful to stay away from the ureter.  The adnexa was now free and put in the pelvis.  The procedure was repeated on the left side.  The ureter was identified.  The infundibulopelvic ligament was clamped, bipolared, and cut, and once again, the adnexa was separated from the broad ligament and the cuff by bipolaring and cutting, once again, staying away from the ureter.  Now, both adnexa were free.  A 5 mm laparoscope was placed through the smaller port.  An Endopouch was placed through the subumbilical port.  Both specimens were placed in the port to prevent spillage in case of a neoplasm within the ovary, and then both adnexa were removed.  They were sent to pathology for tissue diagnosis.  There  was no bleeding noted.  Copious irrigation was done with Ringers lactate.  No cul-de-sac fluid was left in place.  All trocars were removed.  The subumbilical incision was closed fascially with a 0 Vicryl and all three skin incisions were closed with 3-0 Monocryl.  Estimated blood loss for the entire procedure was less than 100 cc and none replaced.  The patient left the operating room in satisfactory condition draining clear urine from her Foley catheter. DD:  10/11/00 TD:  10/12/00 Job: [id number]

## 2011-03-02 NOTE — Procedures (Signed)
[facility]  Patient:    Jade Jenkins, Jade Jenkins Visit Number: [id number] MRN: [id number]          Service Type: END Location: ENDO Attending Physician:  Rich Brave Dictated by:   Florencia Reasons, M.D. Proc. Date: 04/08/02 Admit Date:  04/08/2002   CC:         Heather Roberts, M.D.   Procedure Report  PROCEDURE:  Colonoscopy with biopsies.  SURGEON:  Florencia Reasons, M.D.  INDICATIONS:  Screening for colon cancer in a 77 year old African American female without worrisome symptoms.  FINDINGS:  Probable rectal leiomyoma.  Diminutive rectal polyp.  DESCRIPTION OF PROCEDURE:  The nature, purpose, and risks of the procedure had been discussed with the patient who provided written consent.  The Olympus adjustable tension pediatric video colonoscope was advanced to the cecum without significant difficulty and pull back was then performed.  The quality of the prep was excellent, and it was felt that all areas were well-seen.  At about 15 cm, there was a diminutive 2 mm to 3 mm sessile hyperplastic-appearing polyp, removed by a couple of cold biopsies.  There was also, at about 7 cm from the external anal opening, a 3 cm submucosal smooth semi-pedunculated mass, consistent with a leiomyoma.  It had no ulceration. The overlying mucosa was completely normal in appearance.  It was slightly firm to probing with the biopsy forceps.  Multiple "bite-on-bite" biopsies were obtained to try to get some submucosal tissue for analysis.  Retroflexion of the rectum as well as reinspection of the rectosigmoid was otherwise unremarkable.  There was no evidence of cancer, colitis, vascular malformations, or diverticulosis.  The patient tolerated the procedure well and there were no apparent complications.  IMPRESSION: 1. Diminutive rectal polyp, removed. 2. Submucosal distal rectal lesion, probably a leiomyoma based on its    morphologic  appearance.  PLAN:  Await pathology on biopsies. Dictated by:   Florencia Reasons, M.D. Attending Physician:  Rich Brave DD:  04/08/02 TD:  04/09/02 Job: [id number]

## 2011-07-09 LAB — BASIC METABOLIC PANEL
CO2: 30
Calcium: 9.4
Creatinine, Ser: 0.82
GFR calc Af Amer: >60
GFR calc non Af Amer: >60
Glucose, Bld: 96
Sodium: 139

## 2011-08-14 ENCOUNTER — Encounter: Payer: Self-pay | Admitting: Gynecology

## 2011-08-14 DIAGNOSIS — D069 Carcinoma in situ of cervix, unspecified: Secondary | ICD-10-CM | POA: Insufficient documentation

## 2011-08-14 DIAGNOSIS — D219 Benign neoplasm of connective and other soft tissue, unspecified: Secondary | ICD-10-CM | POA: Insufficient documentation

## 2011-08-14 DIAGNOSIS — M8580 Other specified disorders of bone density and structure, unspecified site: Secondary | ICD-10-CM | POA: Insufficient documentation

## 2011-08-14 DIAGNOSIS — C801 Malignant (primary) neoplasm, unspecified: Secondary | ICD-10-CM | POA: Insufficient documentation

## 2011-08-14 DIAGNOSIS — E559 Vitamin D deficiency, unspecified: Secondary | ICD-10-CM | POA: Insufficient documentation

## 2011-08-22 ENCOUNTER — Encounter: Payer: Self-pay | Admitting: Obstetrics and Gynecology

## 2011-08-22 ENCOUNTER — Ambulatory Visit (INDEPENDENT_AMBULATORY_CARE_PROVIDER_SITE_OTHER): Payer: Medicare Other | Admitting: Obstetrics and Gynecology

## 2011-08-22 VITALS — BP 136/80 | Ht 65.0 in | Wt 141.0 lb

## 2011-08-22 DIAGNOSIS — M899 Disorder of bone, unspecified: Secondary | ICD-10-CM

## 2011-08-22 DIAGNOSIS — N952 Postmenopausal atrophic vaginitis: Secondary | ICD-10-CM

## 2011-08-22 DIAGNOSIS — D069 Carcinoma in situ of cervix, unspecified: Secondary | ICD-10-CM

## 2011-08-22 DIAGNOSIS — E559 Vitamin D deficiency, unspecified: Secondary | ICD-10-CM

## 2011-08-22 DIAGNOSIS — M8580 Other specified disorders of bone density and structure, unspecified site: Secondary | ICD-10-CM

## 2011-08-22 NOTE — Progress Notes (Signed)
Patient came back to see me today for further followup. The first and we discussed is her low bone mass. We did followup this year on her bone density. She is currently on drug holiday from biphosphonate's. She continues to have low bone mass on bone density. However her FRAX risk is not increased. She also had significant improvement in bone density of her hip. She takes calcium and vitamin D. She had a vitamin D deficiency which has been corrected with vitamin D. She's had no fractures. She also has had previous CIN-3. 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 She has atrophic vaginitis but is not sexually active and does not need treatment. She is having no pelvic pain or vaginal bleeding. She continues to be cancer free from her colon cancer.  ROS:12 system review done. Only pertinent positives above.  HEENT: Within normal limits. Kennon Portela present Neck: No masses. Supraclavicular lymph nodes: Not enlarged. Breasts: Examined in both sitting and lying position. Symmetrical without skin changes or masses. Abdomen: Soft no masses guarding or rebound. No hernias. Pelvic: External within normal limits. BUS within normal limits. Vaginal examination shows poor estrogen effect, no cystocele enterocele or rectocele. Cervix and uterus absent. Adnexa within normal limits. Rectovaginal confirmatory. Extremities within normal limits.  Assessment: #1. Low bone mass #2. Atrophic vaginitis #3. Vitamin D deficiency #4. CIN-3  Plan: Continued observation of the above. Continue calcium and vitamin D. Continue yearly mammograms.

## 2011-10-10 ENCOUNTER — Telehealth: Payer: Self-pay | Admitting: *Deleted

## 2011-10-10 ENCOUNTER — Other Ambulatory Visit: Payer: Self-pay | Admitting: *Deleted

## 2011-10-10 DIAGNOSIS — C801 Malignant (primary) neoplasm, unspecified: Secondary | ICD-10-CM

## 2011-10-10 DIAGNOSIS — C20 Malignant neoplasm of rectum: Secondary | ICD-10-CM

## 2011-10-10 NOTE — Telephone Encounter (Signed)
Pt called to schedule appt w/ Dr. Gaylyn Rong.  Denies any new problems,  States just needs to f/u.  Pt missed f/u visit in May 2012.  POF to scheduling per Dr. Gaylyn Rong for labs and to see pt w/i one month.  Informed pt that scheduling will be calling her w/ a date and time.  She verbalized understanding.

## 2011-10-11 ENCOUNTER — Telehealth: Payer: Self-pay | Admitting: Oncology

## 2011-10-11 NOTE — Telephone Encounter (Signed)
lmonvm advising the pt of her jan appts with dr Gaylyn Rong

## 2011-10-28 ENCOUNTER — Encounter: Payer: Self-pay | Admitting: Oncology

## 2011-11-09 ENCOUNTER — Ambulatory Visit: Payer: Medicare Other | Admitting: Oncology

## 2011-11-09 ENCOUNTER — Other Ambulatory Visit: Payer: Medicare Other | Admitting: Lab

## 2011-11-15 ENCOUNTER — Other Ambulatory Visit (HOSPITAL_BASED_OUTPATIENT_CLINIC_OR_DEPARTMENT_OTHER): Payer: Medicare Other | Admitting: Lab

## 2011-11-15 ENCOUNTER — Ambulatory Visit (HOSPITAL_BASED_OUTPATIENT_CLINIC_OR_DEPARTMENT_OTHER): Payer: Medicare Other | Admitting: Oncology

## 2011-11-15 ENCOUNTER — Telehealth: Payer: Self-pay | Admitting: Oncology

## 2011-11-15 VITALS — BP 173/82 | HR 76 | Temp 97.5°F | Ht 65.0 in | Wt 145.3 lb

## 2011-11-15 DIAGNOSIS — I10 Essential (primary) hypertension: Secondary | ICD-10-CM

## 2011-11-15 DIAGNOSIS — C20 Malignant neoplasm of rectum: Secondary | ICD-10-CM

## 2011-11-15 DIAGNOSIS — F329 Major depressive disorder, single episode, unspecified: Secondary | ICD-10-CM

## 2011-11-15 DIAGNOSIS — Z85048 Personal history of other malignant neoplasm of rectum, rectosigmoid junction, and anus: Secondary | ICD-10-CM

## 2011-11-15 LAB — COMPREHENSIVE METABOLIC PANEL
ALT: 13 U/L (ref 0–35)
CO2: 32 mEq/L (ref 19–32)
Calcium: 9.4 mg/dL (ref 8.4–10.5)
Chloride: 104 mEq/L (ref 96–112)
Creatinine, Ser: 0.87 mg/dL (ref 0.50–1.10)
Glucose, Bld: 89 mg/dL (ref 70–99)
Total Protein: 6.9 g/dL (ref 6.0–8.3)

## 2011-11-15 LAB — CBC WITH DIFFERENTIAL/PLATELET
BASO%: 0.6 % (ref 0.0–2.0)
Basophils Absolute: 0 10*3/uL (ref 0.0–0.1)
Eosinophils Absolute: 0.1 10*3/uL (ref 0.0–0.5)
HCT: 35.9 % (ref 34.8–46.6)
HGB: 11.9 g/dL (ref 11.6–15.9)
LYMPH%: 30.1 % (ref 14.0–49.7)
MCHC: 33.2 g/dL (ref 31.5–36.0)
MONO#: 0.6 10*3/uL (ref 0.1–0.9)
NEUT#: 2.3 10*3/uL (ref 1.5–6.5)
NEUT%: 52.4 % (ref 38.4–76.8)
Platelets: 237 10*3/uL (ref 145–400)
WBC: 4.4 10*3/uL (ref 3.9–10.3)
lymph#: 1.3 10*3/uL (ref 0.9–3.3)

## 2011-11-15 LAB — CEA: CEA: 0.7 ng/mL (ref 0.0–5.0)

## 2011-11-15 NOTE — Telephone Encounter (Signed)
Gv pt appt for jan2014 °

## 2011-11-15 NOTE — Progress Notes (Signed)
Received report from [facility];  Forwarded to Dr. Gaylyn Rong.

## 2011-11-15 NOTE — Progress Notes (Signed)
[facility] OFF[facility]  Cc:  Florencia Reasons, MD, MD  DIAGNOSIS:  Rectal carcinoma status post rectal excision on August 2003.  She did not require adjuvant therapy.  CURRENT THERAPY:  watchful observation.  INTERVAL HISTORY: Jade Jenkins 78 y.o. female returns for regular follow up. She is doing well.  She has normal appetite and stable weight per her report.  She denies rectal/pelvic pain.  Patient denies fatigue, headache, visual changes, confusion, drenching night sweats, palpable lymph node swelling, mucositis, odynophagia, dysphagia, nausea vomiting, jaundice, chest pain, palpitation, shortness of breath, dyspnea on exertion, productive cough, gum bleeding, epistaxis, hematemesis, hemoptysis, abdominal pain, abdominal swelling, early satiety, melena, hematochezia, hematuria, skin rash, spontaneous bleeding, joint swelling, joint pain, heat or cold intolerance, bowel bladder incontinence, back pain, focal motor weakness, paresthesia, depression, suicidal or homocidal ideation, feeling hopelessness.   MEDICAL HISTORY: Past Medical History  Diagnosis Date  . CIN III (cervical intraepithelial neoplasia III)     conization  . Osteopenia   . Vitamin d deficiency   . Fibroid   . DVT (deep venous thrombosis)   . Rectal cancer 05/2002    s/p resection  . Hypertension   . Depression     SURGICAL HISTORY:  Past Surgical History  Procedure Date  . Gynecologic cryosurgery   . Cervical cone biopsy     CIN lll  . Rotator cuff repair   . Tubal ligation   . Vaginal hysterectomy   . Pelvic laparoscopy 2001    BSO  . Colon carcinoid   . Oophorectomy 2001    BSO    MEDICATIONS: Current Outpatient Prescriptions  Medication Sig Dispense Refill  . B Complex Vitamins (VITAMIN-B COMPLEX PO) Take by mouth.        . diphenhydrAMINE (SOMINEX) 25 MG tablet Take 25 mg by mouth as needed.      Marland Kitchen PARoxetine (PAXIL) 20 MG tablet Take 20 mg by mouth every morning.          . valsartan-hydrochlorothiazide (DIOVAN-HCT) 320-25 MG per tablet Take 1 tablet by mouth daily.          ALLERGIES:  is allergic to codeine.  REVIEW OF SYSTEMS:  The rest of the 14-point review of system was negative.   Filed Vitals:   11/15/11 1019  BP: 173/82  Pulse: 76  Temp: 97.5 F (36.4 C)   Wt Readings from Last 3 Encounters:  11/15/11 145 lb 4.8 oz (65.908 kg)  08/22/11 141 lb (63.957 kg)   ECOG Performance status: 0  PHYSICAL EXAMINATION:  General:  well-nourished in no acute distress.  Eyes:  no scleral icterus.  ENT:  There were no oropharyngeal lesions.  Neck was without thyromegaly.  Lymphatics:  Negative cervical, supraclavicular or axillary adenopathy.  Respiratory: lungs were clear bilaterally without wheezing or crackles.  Cardiovascular:  Regular rate and rhythm, S1/S2, without murmur, rub or gallop.  There was no pedal edema.  GI:  abdomen was soft, flat, nontender, nondistended, without organomegaly.  Muscoloskeletal:  no spinal tenderness of palpation of vertebral spine.  Skin exam was without echymosis, petichae.  Neuro exam was nonfocal.  Patient was able to get on and off exam table without assistance.  Gait was normal.  Patient was alerted and oriented.  Attention was good.   Language was appropriate.  Mood was normal without depression.  Speech was not pressured.  Thought content was not tangential.     LABORATORY/RADIOLOGY DATA:  Lab Results  Component Value Date  WBC 4.4 11/15/2011   HGB 11.9 11/15/2011   HCT 35.9 11/15/2011   PLT 237 11/15/2011   GLUCOSE 102* 09/04/2010   ALT 10 09/04/2010   AST 18 09/04/2010   NA 141 09/04/2010   K 3.2* 09/04/2010   CL 100 09/04/2010   CREATININE 0.83 09/04/2010   BUN 18 09/04/2010   CO2 32 09/04/2010    ASSESSMENT & PLAN:  1.  History of rectal carcinoma:  She is approximately 10 years out from the diagnosis.  She has no evidence of recurrence or metastatic disease on clinical history, physical exam,  laboratory test today. I discussed with her that the chance of cancer recurrence after 10 years is low; however, she prefers to continue to follow up with the Cancer Center.  Thus, I decreased the frequency of visit to once a year.  I advised her to f/u with her PCP at least 2x/year.  I educated her to inform us of concerning symptoms such as jaundice, abdomin/pelvic pain, weakness, weight loss.   2.  Hypertension:  She is on valsartan/HCTZ per PCP.  Her BP is still slightly elevated today.  I advised her to see her PCP to see if further titration of her BP med is appropriate.    3.  Depression:  Stable mood on paroxetine per PCP.  4.  Surveillance:  She had a colonoscopy in 12/2010 with Dr. Matthias Hughs.  I'm awaiting the report.   Her next one is due 12/2015.  She is also due for a mammogram.  She will schedule this herself.   5. F/U:  Lab/RV with me in 1 year.

## 2012-08-28 ENCOUNTER — Ambulatory Visit (INDEPENDENT_AMBULATORY_CARE_PROVIDER_SITE_OTHER): Payer: Medicare Other | Admitting: Obstetrics and Gynecology

## 2012-08-28 ENCOUNTER — Other Ambulatory Visit (HOSPITAL_COMMUNITY)
Admission: RE | Admit: 2012-08-28 | Discharge: 2012-08-28 | Disposition: A | Discharge status: Home / Self Care | Payer: Medicare Other | Source: Ambulatory Visit | Attending: Obstetrics and Gynecology | Admitting: Obstetrics and Gynecology

## 2012-08-28 ENCOUNTER — Encounter: Payer: Self-pay | Admitting: Obstetrics and Gynecology

## 2012-08-28 VITALS — BP 120/78 | Ht 64.0 in | Wt 142.0 lb

## 2012-08-28 DIAGNOSIS — M8580 Other specified disorders of bone density and structure, unspecified site: Secondary | ICD-10-CM

## 2012-08-28 DIAGNOSIS — M899 Disorder of bone, unspecified: Secondary | ICD-10-CM

## 2012-08-28 DIAGNOSIS — E559 Vitamin D deficiency, unspecified: Secondary | ICD-10-CM

## 2012-08-28 DIAGNOSIS — F329 Major depressive disorder, single episode, unspecified: Secondary | ICD-10-CM | POA: Insufficient documentation

## 2012-08-28 DIAGNOSIS — F32A Depression, unspecified: Secondary | ICD-10-CM | POA: Insufficient documentation

## 2012-08-28 DIAGNOSIS — D069 Carcinoma in situ of cervix, unspecified: Secondary | ICD-10-CM

## 2012-08-28 DIAGNOSIS — N952 Postmenopausal atrophic vaginitis: Secondary | ICD-10-CM

## 2012-08-28 DIAGNOSIS — Z01419 Encounter for gynecological examination (general) (routine) without abnormal findings: Secondary | ICD-10-CM | POA: Insufficient documentation

## 2012-08-28 NOTE — Progress Notes (Signed)
Patient came to see me today for further followup. Prior to her hysterectomy in 1980 done vaginally for cervical dysplasia And fibroids patient had a conization for CIN-3 and then had a recurrence and was treated with Conization again. She underwent diagnostic laparoscopy with bilateral salpingo-oophorectomy in 2001 for benign ovarian disease. Her Paps have remained normal since her hysterectomy. Her last Pap was July, 2011. She also had a carcinoid tumor of her colon removed in 2003. She is having no vaginal bleeding. She is having no pelvic pain. She is up-to-date on mammograms. Her last bone density was in 2012 and showed low bone mass. It was statistically better than the previous one in 2009. She had previously been treated with biphosphonate's but is remaining on drug holiday. She has had no fractures. She has also been treated for a vitamin D deficiency. She is not sexually active. She does have atrophic vaginitis but is asymptomatic.  ROS: 12 system review done. Pertinent positives above.  HEENT: Within normal limits.Kennon Portela present. Neck: No masses. Supraclavicular lymph nodes: Not enlarged. Breasts: Examined in both sitting and lying position. Symmetrical without skin changes or masses. Abdomen: Soft no masses guarding or rebound. No hernias. Pelvic: External within normal limits. BUS within normal limits. Vaginal examination shows poor  estrogen effect, no cystocele enterocele or rectocele. Cervix and uterus absent. Adnexa within normal limits. Rectovaginal confirmatory. Extremities within normal limits.  Assessment: #1. Atrophic vaginitis #2. CIN-3 #3. Osteopenia #4. Vitamin D deficiency  Plan: Continue yearly mammograms. Bone density 2014. Pap done.

## 2012-08-28 NOTE — Patient Instructions (Signed)
Continue yearly mammograms. Bone density in 2014. 

## 2012-09-12 ENCOUNTER — Encounter: Payer: Self-pay | Admitting: Obstetrics and Gynecology

## 2012-11-14 ENCOUNTER — Other Ambulatory Visit (HOSPITAL_BASED_OUTPATIENT_CLINIC_OR_DEPARTMENT_OTHER): Payer: Medicare Other | Admitting: Lab

## 2012-11-14 ENCOUNTER — Telehealth: Payer: Self-pay | Admitting: Oncology

## 2012-11-14 ENCOUNTER — Ambulatory Visit (HOSPITAL_BASED_OUTPATIENT_CLINIC_OR_DEPARTMENT_OTHER): Payer: Medicare Other | Admitting: Oncology

## 2012-11-14 VITALS — BP 163/89 | HR 74 | Temp 98.3°F | Resp 18 | Ht 64.0 in | Wt 143.3 lb

## 2012-11-14 DIAGNOSIS — Z85048 Personal history of other malignant neoplasm of rectum, rectosigmoid junction, and anus: Secondary | ICD-10-CM

## 2012-11-14 DIAGNOSIS — C20 Malignant neoplasm of rectum: Secondary | ICD-10-CM

## 2012-11-14 LAB — COMPREHENSIVE METABOLIC PANEL (CC13)
BUN: 16.9 mg/dL (ref 7.0–26.0)
CO2: 32 mEq/L — ABNORMAL HIGH (ref 22–29)
Calcium: 9.3 mg/dL (ref 8.4–10.4)
Chloride: 100 mEq/L (ref 98–107)
Creatinine: 0.9 mg/dL (ref 0.6–1.1)
Glucose: 86 mg/dl (ref 70–99)
Total Bilirubin: 0.42 mg/dL (ref 0.20–1.20)

## 2012-11-14 LAB — CBC WITH DIFFERENTIAL/PLATELET
BASO%: 0.8 % (ref 0.0–2.0)
EOS%: 2.3 % (ref 0.0–7.0)
HGB: 11.7 g/dL (ref 11.6–15.9)
MCH: 26.6 pg (ref 25.1–34.0)
MCHC: 33.2 g/dL (ref 31.5–36.0)
MONO#: 0.8 10*3/uL (ref 0.1–0.9)
RDW: 15.9 % — ABNORMAL HIGH (ref 11.2–14.5)
WBC: 5.3 10*3/uL (ref 3.9–10.3)
lymph#: 1.6 10*3/uL (ref 0.9–3.3)

## 2012-11-14 NOTE — Progress Notes (Signed)
[facility] OFFICE PROGRESS NOTE  Cc:  Katy Apo, MD  DIAGNOSIS:  Rectal carcinoma status post rectal excision on August 2003.  She did not require adjuvant therapy.  CURRENT THERAPY:  watchful observation.  INTERVAL HISTORY: Jade Jenkins 79 y.o. female returns for regular follow up. She is doing well.  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  She denied abdominal/pelvic/rectal pain.  She denied dizziness, SOB, chest pain, palpitation, bleeding symptoms, jaundice, abdominal pain.  The rest of the 14-point review of system was negative.    MEDICAL HISTORY: Past Medical History  Diagnosis Date  . CIN III (cervical intraepithelial neoplasia III)     conization  . Osteopenia   . Vitamin D deficiency   . DVT (deep venous thrombosis)   . Hypertension   . Depression   . Rectal cancer 05/2002    s/p resection  . Fibroid     SURGICAL HISTORY:  Past Surgical History  Procedure Date  . Gynecologic cryosurgery   . Cervical cone biopsy     CIN lll  . Rotator cuff repair   . Tubal ligation   . Vaginal hysterectomy   . Pelvic laparoscopy 2001    BSO  . Colon carcinoid   . Oophorectomy 2001    BSO  . Colposcopy     MEDICATIONS: Current Outpatient Prescriptions  Medication Sig Dispense Refill  . B Complex Vitamins (VITAMIN-B COMPLEX PO) Take by mouth.        Marland Kitchen PARoxetine (PAXIL) 20 MG tablet Take 20 mg by mouth every morning.        . valsartan-hydrochlorothiazide (DIOVAN-HCT) 320-25 MG per tablet Take 1 tablet by mouth daily.          ALLERGIES:  is allergic to codeine.  REVIEW OF SYSTEMS:  The rest of the 14-point review of system was negative.   Filed Vitals:   11/14/12 1033  BP: 163/89  Pulse: 74  Temp: 98.3 F (36.8 C)  Resp: 18   Wt Readings from Last 3 Encounters:  11/14/12 143 lb 4.8 oz (65 kg)  08/28/12 142 lb (64.411 kg)  11/15/11 145 lb 4.8 oz (65.908 kg)   ECOG Performance status: 0  PHYSICAL EXAMINATION:  General:  well-nourished in  no acute distress.  Eyes:  no scleral icterus.  ENT:  There were no oropharyngeal lesions.  Neck was without thyromegaly.  Lymphatics:  Negative cervical, supraclavicular or axillary adenopathy.  Respiratory: lungs were clear bilaterally without wheezing or crackles.  Cardiovascular:  Regular rate and rhythm, S1/S2, without murmur, rub or gallop.  There was no pedal edema.  GI:  abdomen was soft, flat, nontender, nondistended, without organomegaly.  Muscoloskeletal:  no spinal tenderness of palpation of vertebral spine.  Skin exam was without echymosis, petichae.  Neuro exam was nonfocal.  Patient was able to get on and off exam table without assistance.  Gait was normal.  Patient was alerted and oriented.  Attention was good.   Language was appropriate.  Mood was normal without depression.  Speech was not pressured.  Thought content was not tangential.     LABORATORY/RADIOLOGY DATA:  Lab Results  Component Value Date   WBC 5.3 11/14/2012   HGB 11.7 11/14/2012   HCT 35.3 11/14/2012   PLT 248 11/14/2012   GLUCOSE 86 11/14/2012   ALT 14 11/14/2012   AST 22 11/14/2012   NA 141 11/14/2012   K 3.4* 11/14/2012   CL 100 11/14/2012   CREATININE 0.9 11/14/2012   BUN  16.9 11/14/2012   CO2 32* 11/14/2012    ASSESSMENT & PLAN:  1.  History of rectal carcinoma:  No evidence of disease recurrence or metastatic.  I recommended to continue watchful observation.   2.  Hypertension:  She is on valsartan/HCTZ.   3.  Depression:  Stable mood on paroxetine per PCP.  4.  Surveillance:  She had a colonoscopy in 12/2010 with Dr. Matthias Hughs.  I'm awaiting the report.   Her next one is due 12/2015.    5. F/U:  Lab/RV with me in 1 year.

## 2012-11-14 NOTE — Telephone Encounter (Signed)
gv and printed appt schedule for pt for Jan 2015 °

## 2013-09-18 ENCOUNTER — Telehealth: Payer: Self-pay | Admitting: *Deleted

## 2013-09-18 NOTE — Telephone Encounter (Signed)
sw pt informed her on 11/13/13 NIG would like for her to come in @ 12:30pm and ov@ 1pm. Pt is aware...td

## 2013-11-12 ENCOUNTER — Other Ambulatory Visit: Payer: Self-pay | Admitting: Hematology and Oncology

## 2013-11-12 DIAGNOSIS — C20 Malignant neoplasm of rectum: Secondary | ICD-10-CM

## 2013-11-13 ENCOUNTER — Ambulatory Visit (HOSPITAL_BASED_OUTPATIENT_CLINIC_OR_DEPARTMENT_OTHER): Payer: Medicare Other | Admitting: Hematology and Oncology

## 2013-11-13 ENCOUNTER — Encounter: Payer: Self-pay | Admitting: Hematology and Oncology

## 2013-11-13 ENCOUNTER — Ambulatory Visit: Payer: Medicare Other | Admitting: Oncology

## 2013-11-13 ENCOUNTER — Other Ambulatory Visit (HOSPITAL_BASED_OUTPATIENT_CLINIC_OR_DEPARTMENT_OTHER): Payer: Medicare Other

## 2013-11-13 ENCOUNTER — Other Ambulatory Visit: Payer: Medicare Other | Admitting: Lab

## 2013-11-13 ENCOUNTER — Telehealth: Payer: Self-pay | Admitting: Hematology and Oncology

## 2013-11-13 VITALS — BP 151/70 | HR 76 | Temp 98.4°F | Resp 20 | Ht 64.0 in | Wt 140.2 lb

## 2013-11-13 DIAGNOSIS — C20 Malignant neoplasm of rectum: Secondary | ICD-10-CM

## 2013-11-13 DIAGNOSIS — C7A026 Malignant carcinoid tumor of the rectum: Secondary | ICD-10-CM

## 2013-11-13 LAB — CBC WITH DIFFERENTIAL/PLATELET
BASO%: 1 % (ref 0.0–2.0)
Basophils Absolute: 0.1 10*3/uL (ref 0.0–0.1)
EOS ABS: 0.2 10*3/uL (ref 0.0–0.5)
EOS%: 4.4 % (ref 0.0–7.0)
HCT: 36.9 % (ref 34.8–46.6)
HGB: 12.1 g/dL (ref 11.6–15.9)
LYMPH%: 32.5 % (ref 14.0–49.7)
MCH: 26.9 pg (ref 25.1–34.0)
MCHC: 32.8 g/dL (ref 31.5–36.0)
MCV: 81.9 fL (ref 79.5–101.0)
MONO#: 0.8 10*3/uL (ref 0.1–0.9)
MONO%: 13.6 % (ref 0.0–14.0)
NEUT%: 48.5 % (ref 38.4–76.8)
NEUTROS ABS: 2.8 10*3/uL (ref 1.5–6.5)
PLATELETS: 280 10*3/uL (ref 145–400)
RBC: 4.51 10*6/uL (ref 3.70–5.45)
RDW: 15.9 % — AB (ref 11.2–14.5)
WBC: 5.7 10*3/uL (ref 3.9–10.3)
lymph#: 1.9 10*3/uL (ref 0.9–3.3)

## 2013-11-13 LAB — COMPREHENSIVE METABOLIC PANEL (CC13)
ALBUMIN: 3.9 g/dL (ref 3.5–5.0)
ALT: 15 U/L (ref 0–55)
ANION GAP: 10 meq/L (ref 3–11)
AST: 19 U/L (ref 5–34)
Alkaline Phosphatase: 57 U/L (ref 40–150)
BUN: 19.6 mg/dL (ref 7.0–26.0)
CO2: 31 meq/L — AB (ref 22–29)
Calcium: 9.6 mg/dL (ref 8.4–10.4)
Chloride: 101 mEq/L (ref 98–109)
Creatinine: 0.9 mg/dL (ref 0.6–1.1)
GLUCOSE: 92 mg/dL (ref 70–140)
POTASSIUM: 3.5 meq/L (ref 3.5–5.1)
SODIUM: 142 meq/L (ref 136–145)
TOTAL PROTEIN: 7.6 g/dL (ref 6.4–8.3)
Total Bilirubin: 0.37 mg/dL (ref 0.20–1.20)

## 2013-11-13 NOTE — Progress Notes (Signed)
[facility] OFFICE PROGRESS NOTE  Patient Care Team: Kandice Hams, MD as PCP - General (Internal Medicine) Cleotis Nipper, MD as Attending Physician (Gastroenterology)  DIAGNOSIS: History of rectal carcinoid tumor status post resection  SUMMARY OF ONCOLOGIC HISTORY: This is a pleasant 80 year old lady who was found to have rectal carcinoid tumor, found during routine colonoscopy. She had rectal excision in August of 2003. Since then, she had a routine colonoscopy. In March of 2012, repeat colonoscopy was negative. She also had numerous CT scans done in the past which show only hepatic cysts and pulmonary nodules, all were benign   INTERVAL HISTORY: Jade Jenkins 80 y.o. female returns for further followup. She denies any rectal bleeding. Her appetite is stable at denies any recent weight loss.  I have reviewed the past medical history, past surgical history, social history and family history with the patient and they are unchanged from previous note.  ALLERGIES:  is allergic to codeine.  MEDICATIONS:  Current Outpatient Prescriptions  Medication Sig Dispense Refill  . amLODipine (NORVASC) 5 MG tablet Take 5 mg by mouth daily.      . B Complex Vitamins (VITAMIN-B COMPLEX PO) Take by mouth.        . dorzolamide-timolol (COSOPT) 22.3-6.8 MG/ML ophthalmic solution Place 1 drop into both eyes 2 (two) times daily.      Marland Kitchen PARoxetine (PAXIL) 20 MG tablet Take 20 mg by mouth every morning.        . valsartan-hydrochlorothiazide (DIOVAN-HCT) 320-25 MG per tablet Take 1 tablet by mouth daily.         No current facility-administered medications for this visit.    REVIEW OF SYSTEMS:   Constitutional: Denies fevers, chills or abnormal weight loss Eyes: Denies blurriness of vision Ears, nose, mouth, throat, and face: Denies mucositis or sore throat Respiratory: Denies cough, dyspnea or wheezes Cardiovascular: Denies palpitation, chest discomfort or lower extremity  swelling Gastrointestinal:  Denies nausea, heartburn or change in bowel habits Skin: Denies abnormal skin rashes Lymphatics: Denies new lymphadenopathy or easy bruising Neurological:Denies numbness, tingling or new weaknesses Behavioral/Psych: Mood is stable, no new changes  All other systems were reviewed with the patient and are negative.  PHYSICAL EXAMINATION: ECOG PERFORMANCE STATUS: 0 - Asymptomatic  Filed Vitals:   11/13/13 1253  BP: 151/70  Pulse: 76  Temp: 98.4 F (36.9 C)  Resp: 20   Filed Weights   11/13/13 1253  Weight: 140 lb 3.2 oz (63.594 kg)    GENERAL:alert, no distress and comfortable SKIN: skin color, texture, turgor are normal, no rashes or significant lesions EYES: normal, Conjunctiva are pink and non-injected, sclera clear OROPHARYNX:no exudate, no erythema and lips, buccal mucosa, and tongue normal  NECK: supple, thyroid normal size, non-tender, without nodularity LYMPH:  no palpable lymphadenopathy in the cervical, axillary or inguinal LUNGS: clear to auscultation and percussion with normal breathing effort HEART: regular rate & rhythm and no murmurs and no lower extremity edema ABDOMEN:abdomen soft, non-tender and normal bowel sounds Musculoskeletal:no cyanosis of digits and no clubbing  NEURO: alert & oriented x 3 with fluent speech, no focal motor/sensory deficits Rectal examination were performed which show no palpable abnormalities LABORATORY DATA:  I have reviewed the data as listed    Component Value Date/Time   NA 141 11/14/2012 1021   NA 140 11/15/2011 1009   K 3.4* 11/14/2012 1021   K 3.9 11/15/2011 1009   CL 100 11/14/2012 1021   CL 104 11/15/2011 1009  CO2 32* 11/14/2012 1021   CO2 32 11/15/2011 1009   GLUCOSE 86 11/14/2012 1021   GLUCOSE 89 11/15/2011 1009   BUN 16.9 11/14/2012 1021   BUN 15 11/15/2011 1009   CREATININE 0.9 11/14/2012 1021   CREATININE 0.87 11/15/2011 1009   CALCIUM 9.3 11/14/2012 1021   CALCIUM 9.4 11/15/2011 1009   PROT  7.4 11/14/2012 1021   PROT 6.9 11/15/2011 1009   ALBUMIN 3.5 11/14/2012 1021   ALBUMIN 4.1 11/15/2011 1009   AST 22 11/14/2012 1021   AST 20 11/15/2011 1009   ALT 14 11/14/2012 1021   ALT 13 11/15/2011 1009   ALKPHOS 55 11/14/2012 1021   ALKPHOS 50 11/15/2011 1009   BILITOT 0.42 11/14/2012 1021   BILITOT 0.4 11/15/2011 1009   GFRNONAA >60 12/15/2007 1035   GFRAA  Value: >60        The eGFR has been calculated using the MDRD equation. This calculation has not been validated in all clinical 12/15/2007 1035    No results found for this basename: SPEP, UPEP,  kappa and lambda light chains    Lab Results  Component Value Date   WBC 5.7 11/13/2013   NEUTROABS 2.8 11/13/2013   HGB 12.1 11/13/2013   HCT 36.9 11/13/2013   MCV 81.9 11/13/2013   PLT 280 11/13/2013      Chemistry      Component Value Date/Time   NA 141 11/14/2012 1021   NA 140 11/15/2011 1009   K 3.4* 11/14/2012 1021   K 3.9 11/15/2011 1009   CL 100 11/14/2012 1021   CL 104 11/15/2011 1009   CO2 32* 11/14/2012 1021   CO2 32 11/15/2011 1009   BUN 16.9 11/14/2012 1021   BUN 15 11/15/2011 1009   CREATININE 0.9 11/14/2012 1021   CREATININE 0.87 11/15/2011 1009      Component Value Date/Time   CALCIUM 9.3 11/14/2012 1021   CALCIUM 9.4 11/15/2011 1009   ALKPHOS 55 11/14/2012 1021   ALKPHOS 50 11/15/2011 1009   AST 22 11/14/2012 1021   AST 20 11/15/2011 1009   ALT 14 11/14/2012 1021   ALT 13 11/15/2011 1009   BILITOT 0.42 11/14/2012 1021   BILITOT 0.4 11/15/2011 1009     ASSESSMENT & PLAN:  #1 rectal carcinoid T1, N0, M0 I would continue history, physical examination, rectal exam along with blood work on a yearly basis #2 preventive care I discussed with the influenza vaccination today but she declined  Orders Placed This Encounter  Procedures  . CBC with Differential    Standing Status: Future     Number of Occurrences:      Standing Expiration Date: 11/13/2014  . Comprehensive metabolic panel    Standing Status: Future     Number of  Occurrences:      Standing Expiration Date: 11/13/2014   All questions were answered. The patient knows to call the clinic with any problems, questions or concerns. No barriers to learning was detected. I spent 15 minutes counseling the patient face to face. The total time spent in the appointment was 20 minutes and more than 50% was on counseling and review of test results     [facility], [place], MD 11/13/2013 1:14 PM

## 2013-11-13 NOTE — Telephone Encounter (Signed)
gv adn printd appt sched and avs forpt for March 2016

## 2013-11-14 LAB — CEA: CEA: <0.5 ng/mL (ref 0.0–5.0)

## 2014-04-22 ENCOUNTER — Ambulatory Visit (INDEPENDENT_AMBULATORY_CARE_PROVIDER_SITE_OTHER): Payer: Medicare Other | Admitting: Gynecology

## 2014-04-22 ENCOUNTER — Encounter: Payer: Self-pay | Admitting: Gynecology

## 2014-04-22 ENCOUNTER — Other Ambulatory Visit (HOSPITAL_COMMUNITY)
Admission: RE | Admit: 2014-04-22 | Discharge: 2014-04-22 | Disposition: A | Discharge status: Home / Self Care | Payer: Medicare Other | Source: Ambulatory Visit | Attending: Gynecology | Admitting: Gynecology

## 2014-04-22 VITALS — BP 146/82 | Ht 64.0 in | Wt 141.0 lb

## 2014-04-22 DIAGNOSIS — Z124 Encounter for screening for malignant neoplasm of cervix: Secondary | ICD-10-CM | POA: Insufficient documentation

## 2014-04-22 DIAGNOSIS — N952 Postmenopausal atrophic vaginitis: Secondary | ICD-10-CM

## 2014-04-22 DIAGNOSIS — Z1151 Encounter for screening for human papillomavirus (HPV): Secondary | ICD-10-CM | POA: Insufficient documentation

## 2014-04-22 DIAGNOSIS — Z1272 Encounter for screening for malignant neoplasm of vagina: Secondary | ICD-10-CM

## 2014-04-22 DIAGNOSIS — Z87898 Personal history of other specified conditions: Secondary | ICD-10-CM

## 2014-04-22 DIAGNOSIS — Z8639 Personal history of other endocrine, nutritional and metabolic disease: Secondary | ICD-10-CM

## 2014-04-22 DIAGNOSIS — Z86001 Personal history of in-situ neoplasm of cervix uteri: Secondary | ICD-10-CM

## 2014-04-22 DIAGNOSIS — M8580 Other specified disorders of bone density and structure, unspecified site: Secondary | ICD-10-CM

## 2014-04-22 DIAGNOSIS — M899 Disorder of bone, unspecified: Secondary | ICD-10-CM

## 2014-04-22 DIAGNOSIS — M949 Disorder of cartilage, unspecified: Secondary | ICD-10-CM

## 2014-04-22 NOTE — Patient Instructions (Signed)

## 2014-04-22 NOTE — Progress Notes (Signed)
Jade Jenkins 08-03-1931 [id number]   History:    80 y.o.  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 Patient was previously been followed by my partner who retired Dr. Seth Bake who she saw last approximately 2 years ago. Her history is as follows:  Prior to her hysterectomy in 1980 done vaginally for cervical dysplasia And fibroids patient had a conization for CIN-3 and then had a recurrence and was treated with Conization again. She underwent diagnostic laparoscopy with bilateral salpingo-oophorectomy in 2001 for benign ovarian disease. Her Paps have remained normal since her hysterectomy. Her last Pap was July, 2011. She also had a carcinoid tumor of her colon removed in 2003. She is having no vaginal bleeding.  Her last bone density study showed low bone mass with a T score of -1.5 at the left femoral neck within normal FRAX analysis.She had previously been treated with biphosphonate's but is remaining on drug holiday.   Patient's last colonoscopy was in 2012. Mammogram was this year reportedly normal. Last Pap smear was normal in 2013. Patient with past history of DVT. She is currently being treated by her PCP for vitamin D deficiency. She has had history of atrophic vaginitis but has been asymptomatic. Patient's spouse died several years ago. Patient not sexually active. Her PCP is Dr. Delfina Redwood.  Past medical history,surgical history, family history and social history were all reviewed and documented in the EPIC chart.  Gynecologic History No LMP recorded. Patient has had a hysterectomy. Contraception: status post hysterectomy Last Pap: 2013. Results were: normal Last mammogram: 2015. Results were: normal  Obstetric History OB History  Gravida Para Term Preterm AB SAB TAB Ectopic Multiple Living  5 4 4  1 1    3     # Outcome Date GA Lbr Len/2nd Weight Sex Delivery Anes PTL Lv  5 SAB           4  TRM           3 TRM           2 TRM           1 TRM                ROS: A ROS was performed and pertinent positives and negatives are included in the history.  GENERAL: No fevers or chills. HEENT: No change in vision, no earache, sore throat or sinus congestion. NECK: No pain or stiffness. CARDIOVASCULAR: No chest pain or pressure. No palpitations. PULMONARY: No shortness of breath, cough or wheeze. GASTROINTESTINAL: No abdominal pain, nausea, vomiting or diarrhea, melena or bright red blood per rectum. GENITOURINARY: No urinary frequency, urgency, hesitancy or dysuria. MUSCULOSKELETAL: No joint or muscle pain, no back pain, no recent trauma. DERMATOLOGIC: No rash, no itching, no lesions. ENDOCRINE: No polyuria, polydipsia, no heat or cold intolerance. No recent change in weight. HEMATOLOGICAL: No anemia or easy bruising or bleeding. NEUROLOGIC: No headache, seizures, numbness, tingling or weakness. PSYCHIATRIC: No depression, no loss of interest in normal activity or change in sleep pattern.     Exam: chaperone present  BP 146/82  Ht 5\' 4"  (1.626 m)  Wt 141 lb (63.957 kg)  BMI 24.19 kg/m2  Body mass index is 24.19 kg/(m^2).  General appearance : Well developed well nourished female. No acute distress HEENT: Neck supple, trachea midline, no carotid bruits, no thyroidmegaly Lungs: Clear to auscultation, no rhonchi or  wheezes, or rib retractions  Heart: Regular rate and rhythm, no murmurs or gallops Breast:Examined in sitting and supine position were symmetrical in appearance, no palpable masses or tenderness,  no skin retraction, no nipple inversion, no nipple discharge, no skin discoloration, no axillary or supraclavicular lymphadenopathy Abdomen: no palpable masses or tenderness, no rebound or guarding Extremities: no edema or skin discoloration or tenderness  Pelvic:  Bartholin, Urethra, Skene Glands: Within normal limits             Vagina: No gross lesions or discharge  Cervix:  Absent  Uterus absent  Adnexa  Without masses or tenderness  Anus and perineum  normal   Rectovaginal  normal sphincter tone without palpated masses or tenderness             Hemoccult PCP provides     Assessment/Plan:  80 y.o. female with past history of CIN-3 prior to hysterectomy. Followup Pap smears have been normal. Patient with history of osteopenia will be scheduled for bone density here in our office since her last study was done in 2004. Patient was reminded of the importance of calcium vitamin D and regular exercise for osteoporosis prevention. Pap smear was done today. Her PCP will be doing her blood work.  Note: This dictation was prepared with  Dragon/digital dictation along withSmart phrase technology. Any transcriptional errors that result from this process are unintentional.   Terrance Mass MD, 4:07 PM 04/22/2014

## 2014-04-26 LAB — CYTOLOGY - PAP

## 2014-04-27 ENCOUNTER — Encounter: Payer: Self-pay | Admitting: Obstetrics and Gynecology

## 2014-08-07 ENCOUNTER — Emergency Department (HOSPITAL_COMMUNITY): Payer: Medicare Other

## 2014-08-07 ENCOUNTER — Inpatient Hospital Stay (HOSPITAL_COMMUNITY)
Admission: EM | Admit: 2014-08-07 | Discharge: 2014-08-09 | DRG: 176 | Disposition: A | Discharge status: Home / Self Care | Payer: Medicare Other | Attending: Internal Medicine | Admitting: Internal Medicine

## 2014-08-07 ENCOUNTER — Encounter (HOSPITAL_COMMUNITY): Payer: Self-pay | Admitting: Emergency Medicine

## 2014-08-07 DIAGNOSIS — I2699 Other pulmonary embolism without acute cor pulmonale: Principal | ICD-10-CM | POA: Diagnosis present

## 2014-08-07 DIAGNOSIS — R079 Chest pain, unspecified: Secondary | ICD-10-CM | POA: Diagnosis present

## 2014-08-07 DIAGNOSIS — Z86718 Personal history of other venous thrombosis and embolism: Secondary | ICD-10-CM

## 2014-08-07 DIAGNOSIS — Z90710 Acquired absence of both cervix and uterus: Secondary | ICD-10-CM

## 2014-08-07 DIAGNOSIS — R071 Chest pain on breathing: Secondary | ICD-10-CM | POA: Diagnosis not present

## 2014-08-07 DIAGNOSIS — R091 Pleurisy: Secondary | ICD-10-CM | POA: Diagnosis present

## 2014-08-07 DIAGNOSIS — Z79899 Other long term (current) drug therapy: Secondary | ICD-10-CM

## 2014-08-07 DIAGNOSIS — Z87410 Personal history of cervical dysplasia: Secondary | ICD-10-CM

## 2014-08-07 DIAGNOSIS — F329 Major depressive disorder, single episode, unspecified: Secondary | ICD-10-CM | POA: Diagnosis present

## 2014-08-07 DIAGNOSIS — Z7901 Long term (current) use of anticoagulants: Secondary | ICD-10-CM

## 2014-08-07 DIAGNOSIS — M8580 Other specified disorders of bone density and structure, unspecified site: Secondary | ICD-10-CM | POA: Diagnosis present

## 2014-08-07 DIAGNOSIS — C20 Malignant neoplasm of rectum: Secondary | ICD-10-CM | POA: Diagnosis present

## 2014-08-07 DIAGNOSIS — I10 Essential (primary) hypertension: Secondary | ICD-10-CM | POA: Diagnosis present

## 2014-08-07 DIAGNOSIS — D069 Carcinoma in situ of cervix, unspecified: Secondary | ICD-10-CM

## 2014-08-07 DIAGNOSIS — Z85048 Personal history of other malignant neoplasm of rectum, rectosigmoid junction, and anus: Secondary | ICD-10-CM

## 2014-08-07 DIAGNOSIS — I82409 Acute embolism and thrombosis of unspecified deep veins of unspecified lower extremity: Secondary | ICD-10-CM

## 2014-08-07 DIAGNOSIS — R791 Abnormal coagulation profile: Secondary | ICD-10-CM | POA: Diagnosis present

## 2014-08-07 DIAGNOSIS — Z885 Allergy status to narcotic agent status: Secondary | ICD-10-CM

## 2014-08-07 DIAGNOSIS — Z87891 Personal history of nicotine dependence: Secondary | ICD-10-CM

## 2014-08-07 LAB — CBC
HCT: 33.8 % — ABNORMAL LOW (ref 36.0–46.0)
HEMOGLOBIN: 11.3 g/dL — AB (ref 12.0–15.0)
MCH: 26.7 pg (ref 26.0–34.0)
MCHC: 33.4 g/dL (ref 30.0–36.0)
MCV: 79.9 fL (ref 78.0–100.0)
Platelets: 249 10*3/uL (ref 150–400)
RBC: 4.23 MIL/uL (ref 3.87–5.11)
RDW: 15 % (ref 11.5–15.5)
WBC: 8.8 10*3/uL (ref 4.0–10.5)

## 2014-08-07 LAB — I-STAT TROPONIN, ED: TROPONIN I, POC: 0 ng/mL (ref 0.00–0.08)

## 2014-08-07 LAB — BASIC METABOLIC PANEL
Anion gap: 12 (ref 5–15)
BUN: 22 mg/dL (ref 6–23)
CO2: 30 mEq/L (ref 19–32)
Calcium: 9.3 mg/dL (ref 8.4–10.5)
Chloride: 98 mEq/L (ref 96–112)
Creatinine, Ser: 0.95 mg/dL (ref 0.50–1.10)
GFR calc Af Amer: 64 mL/min — ABNORMAL LOW (ref >90)
GFR, EST NON AFRICAN AMERICAN: 55 mL/min — AB (ref >90)
GLUCOSE: 121 mg/dL — AB (ref 70–99)
Potassium: 3.9 mEq/L (ref 3.7–5.3)
SODIUM: 140 meq/L (ref 137–147)

## 2014-08-07 LAB — PRO B NATRIURETIC PEPTIDE: PRO B NATRI PEPTIDE: 88 pg/mL (ref 0–450)

## 2014-08-07 MED ORDER — SODIUM CHLORIDE 0.9 % IV BOLUS (SEPSIS)
500.0000 mL | Freq: Once | INTRAVENOUS | Status: AC
  Administered 2014-08-07: 500 mL via INTRAVENOUS

## 2014-08-07 MED ORDER — IOHEXOL 300 MG/ML  SOLN
100.0000 mL | Freq: Once | INTRAMUSCULAR | Status: AC | PRN
  Administered 2014-08-07: 100 mL via INTRAVENOUS

## 2014-08-07 NOTE — ED Notes (Signed)
Pt reports R side cp which radiates to her R back x 2-3 days.  Pt reports she thinks that it might be pulled muscle because pain is worse when taking a deep breath and when moving her R arm.  Pt denies SOB, dizziness or lightheadedness at this time.  Pt reports hx of PNA in the past.

## 2014-08-07 NOTE — ED Notes (Addendum)
Pt calm and resting quietly.

## 2014-08-07 NOTE — ED Provider Notes (Signed)
CSN: [id number]     Arrival date & time 08/07/14  1920 History   First MD Initiated Contact with Patient 08/07/14 1959     Chief Complaint  Patient presents with  . Chest Pain     (Consider location/radiation/quality/duration/timing/severity/associated sxs/prior Treatment) Patient is a 80 y.o. female presenting with chest pain. The history is provided by the patient.  Chest Pain Pain location:  R chest Pain quality: aching   Pain radiates to:  Does not radiate Pain radiates to the back: yes   Pain severity:  Mild Onset quality:  Gradual Duration:  3 days Timing:  Constant Progression:  Unchanged Chronicity:  New Relieved by:  Nothing Worsened by:  Nothing tried Associated symptoms: no abdominal pain, no cough, no fever, no shortness of breath and not vomiting     Past Medical History  Diagnosis Date  . CIN III (cervical intraepithelial neoplasia III)     conization  . Osteopenia   . Vitamin D deficiency   . DVT (deep venous thrombosis)   . Hypertension   . Depression   . Rectal cancer 05/2002    s/p resection  . Fibroid    Past Surgical History  Procedure Laterality Date  . Gynecologic cryosurgery    . Cervical cone biopsy      CIN lll  . Rotator cuff repair    . Tubal ligation    . Vaginal hysterectomy    . Pelvic laparoscopy  2001    BSO  . Colon carcinoid    . Oophorectomy  2001    BSO  . Colposcopy     Family History  Problem Relation Age of Onset  . Uterine cancer Mother   . Hypertension Father   . Diabetes Sister   . Hypertension Sister   . Heart disease Sister   . Hypertension Paternal Grandmother   . Hypertension Paternal Grandfather    History  Substance Use Topics  . Smoking status: Former Research scientist (life sciences)  . Smokeless tobacco: Never Used  . Alcohol Use: No   OB History   Grav Para Term Preterm Abortions TAB SAB Ect Mult Living   5 4 4  1  1   3      Review of Systems  Constitutional: Negative for fever.  Respiratory: Negative for cough and  shortness of breath.   Cardiovascular: Positive for chest pain.  Gastrointestinal: Negative for vomiting and abdominal pain.  All other systems reviewed and are negative.     Allergies  Codeine  Home Medications   Prior to Admission medications   Medication Sig Start Date End Date Taking? Authorizing Provider  B Complex Vitamins (VITAMIN-B COMPLEX PO) Take 1 tablet by mouth daily.    Yes Historical Provider, MD  dorzolamide-timolol (COSOPT) 22.3-6.8 MG/ML ophthalmic solution Place 1 drop into both eyes 2 (two) times daily.   Yes Historical Provider, MD  naproxen sodium (ANAPROX) 220 MG tablet Take 220 mg by mouth daily as needed (pain).   Yes Historical Provider, MD  PARoxetine (PAXIL) 20 MG tablet Take 20 mg by mouth every morning.     Yes Historical Provider, MD  valsartan-hydrochlorothiazide (DIOVAN-HCT) 320-25 MG per tablet Take 1 tablet by mouth daily.     Yes Historical Provider, MD  amLODipine (NORVASC) 5 MG tablet Take 5 mg by mouth daily.    Historical Provider, MD   BP 136/60  Pulse 90  Temp(Src) 98 F (36.7 C) (Oral)  Resp 19  SpO2 97% Physical Exam  Nursing note and vitals  reviewed. Constitutional: She is oriented to person, place, and time. She appears well-developed and well-nourished. No distress.  HENT:  Head: Normocephalic and atraumatic.  Mouth/Throat: Oropharynx is clear and moist.  Eyes: EOM are normal. Pupils are equal, round, and reactive to light.  Neck: Normal range of motion. Neck supple.  Cardiovascular: Normal rate and regular rhythm.  Exam reveals no friction rub.   No murmur heard. Pulmonary/Chest: Effort normal and breath sounds normal. No respiratory distress. She has no wheezes. She has no rales. She exhibits no tenderness.  Abdominal: Soft. She exhibits no distension. There is no tenderness. There is no rebound.  Musculoskeletal: Normal range of motion. She exhibits no edema.  Neurological: She is alert and oriented to person, place, and time.   Skin: She is not diaphoretic.    ED Course  Procedures (including critical care time) Labs Review Labs Reviewed  [place], ED    Imaging Review Dg Chest 2 View  08/07/2014   CLINICAL DATA:  Right-sided chest pain radiating to right back for 2-3 days. Pain worse with deep breath and when moving right arm. Remote history of rectal carcinoma.  EXAM: CHEST  2 VIEW  COMPARISON:  Chest CT 06/15/2010  FINDINGS: Heart is normal size. Linear densities in the lung bases likely reflect scarring. No effusions or fluid airspace opacities.  The anterior right fifth rib appears sclerotic. This is new since prior CT. Cannot exclude focal sclerotic rib lesion/metastasis. Consider further evaluation with chest CT if felt clinically indicated.  IMPRESSION: Apparent sclerotic area within the anterior right fifth rib. Cannot exclude sclerotic rib lesion. Consider further evaluation with chest CT.  Bibasilar scarring.   Electronically Signed   By: Rolm Baptise M.D.   On: 08/07/2014 20:07     EKG Interpretation   Date/Time:  Saturday August 07 2014 19:55:12 EDT Ventricular Rate:  92 PR Interval:  176 QRS Duration: 84 QT Interval:  369 QTC Calculation: 456 R Axis:   62 Text Interpretation:  Sinus rhythm Consider left atrial enlargement  Similar to prior Confirmed by Mingo Amber  MD, [place] ([id number]) on 08/07/2014  8:01:44 PM      MDM   Final diagnoses:  Right-sided chest pain  Pulmonary embolism and infarction    12F here with R sided rib pain. Present for 3-4 days. Pain with deep breaths, movement of R arm.  No cough, no fever, no hemoptysis. Sent from Urgent Care. On xray has sclerotic rib lesion in R anterior mid chest wall, will scan her chest to further delineate and look for other reason like possible metastatic disease - has a hx of rectal cancer. CT of chest w/ contrast without any rib lesion - likely artifact. Radiology unable to  assess for PE, will rescan with low contrast bolus.  CT results pending, Dr. Claudine Mouton will follow.    Evelina Bucy, MD 08/09/14 [phone]

## 2014-08-07 NOTE — ED Notes (Addendum)
Family member at bedside.

## 2014-08-08 DIAGNOSIS — I82409 Acute embolism and thrombosis of unspecified deep veins of unspecified lower extremity: Secondary | ICD-10-CM

## 2014-08-08 DIAGNOSIS — I10 Essential (primary) hypertension: Secondary | ICD-10-CM | POA: Diagnosis present

## 2014-08-08 DIAGNOSIS — Z85048 Personal history of other malignant neoplasm of rectum, rectosigmoid junction, and anus: Secondary | ICD-10-CM | POA: Diagnosis not present

## 2014-08-08 DIAGNOSIS — I2699 Other pulmonary embolism without acute cor pulmonale: Secondary | ICD-10-CM | POA: Diagnosis present

## 2014-08-08 DIAGNOSIS — Z885 Allergy status to narcotic agent status: Secondary | ICD-10-CM | POA: Diagnosis not present

## 2014-08-08 DIAGNOSIS — R071 Chest pain on breathing: Secondary | ICD-10-CM

## 2014-08-08 DIAGNOSIS — Z7901 Long term (current) use of anticoagulants: Secondary | ICD-10-CM | POA: Diagnosis not present

## 2014-08-08 DIAGNOSIS — R079 Chest pain, unspecified: Secondary | ICD-10-CM | POA: Diagnosis present

## 2014-08-08 DIAGNOSIS — Z86718 Personal history of other venous thrombosis and embolism: Secondary | ICD-10-CM | POA: Diagnosis not present

## 2014-08-08 DIAGNOSIS — D069 Carcinoma in situ of cervix, unspecified: Secondary | ICD-10-CM

## 2014-08-08 DIAGNOSIS — M8580 Other specified disorders of bone density and structure, unspecified site: Secondary | ICD-10-CM | POA: Diagnosis present

## 2014-08-08 DIAGNOSIS — Z87891 Personal history of nicotine dependence: Secondary | ICD-10-CM | POA: Diagnosis not present

## 2014-08-08 DIAGNOSIS — Z90710 Acquired absence of both cervix and uterus: Secondary | ICD-10-CM | POA: Diagnosis not present

## 2014-08-08 DIAGNOSIS — F329 Major depressive disorder, single episode, unspecified: Secondary | ICD-10-CM | POA: Diagnosis present

## 2014-08-08 DIAGNOSIS — Z79899 Other long term (current) drug therapy: Secondary | ICD-10-CM | POA: Diagnosis not present

## 2014-08-08 DIAGNOSIS — Z87410 Personal history of cervical dysplasia: Secondary | ICD-10-CM | POA: Diagnosis not present

## 2014-08-08 DIAGNOSIS — R791 Abnormal coagulation profile: Secondary | ICD-10-CM | POA: Diagnosis present

## 2014-08-08 LAB — CBC
HCT: 33.4 % — ABNORMAL LOW (ref 36.0–46.0)
HEMOGLOBIN: 10.9 g/dL — AB (ref 12.0–15.0)
MCH: 25.8 pg — ABNORMAL LOW (ref 26.0–34.0)
MCHC: 32.6 g/dL (ref 30.0–36.0)
MCV: 79 fL (ref 78.0–100.0)
Platelets: 237 10*3/uL (ref 150–400)
RBC: 4.23 MIL/uL (ref 3.87–5.11)
RDW: 15 % (ref 11.5–15.5)
WBC: 7.1 10*3/uL (ref 4.0–10.5)

## 2014-08-08 LAB — PROTIME-INR
INR: 1.08 (ref 0.00–1.49)
INR: 1.08 (ref 0.00–1.49)
PROTHROMBIN TIME: 14.1 s (ref 11.6–15.2)
Prothrombin Time: 14.1 seconds (ref 11.6–15.2)

## 2014-08-08 LAB — APTT: aPTT: 30 seconds (ref 24–37)

## 2014-08-08 MED ORDER — WARFARIN VIDEO
Freq: Once | Status: AC
  Administered 2014-08-08: 22:00:00

## 2014-08-08 MED ORDER — ENOXAPARIN SODIUM 80 MG/0.8ML ~~LOC~~ SOLN
1.0000 mg/kg | Freq: Two times a day (BID) | SUBCUTANEOUS | Status: DC
  Administered 2014-08-08 – 2014-08-09 (×3): 65 mg via SUBCUTANEOUS
  Filled 2014-08-08 (×4): qty 0.8

## 2014-08-08 MED ORDER — SODIUM CHLORIDE 0.9 % IJ SOLN
3.0000 mL | Freq: Two times a day (BID) | INTRAMUSCULAR | Status: DC
  Administered 2014-08-08 – 2014-08-09 (×3): 3 mL via INTRAVENOUS

## 2014-08-08 MED ORDER — IOHEXOL 350 MG/ML SOLN
80.0000 mL | Freq: Once | INTRAVENOUS | Status: AC | PRN
  Administered 2014-08-08: 80 mL via INTRAVENOUS

## 2014-08-08 MED ORDER — HYDROCHLOROTHIAZIDE 25 MG PO TABS
25.0000 mg | ORAL_TABLET | Freq: Every day | ORAL | Status: DC
  Administered 2014-08-08 – 2014-08-09 (×2): 25 mg via ORAL
  Filled 2014-08-08 (×2): qty 1

## 2014-08-08 MED ORDER — AMLODIPINE BESYLATE 5 MG PO TABS
5.0000 mg | ORAL_TABLET | Freq: Every day | ORAL | Status: DC
  Administered 2014-08-08 – 2014-08-09 (×2): 5 mg via ORAL
  Filled 2014-08-08 (×2): qty 1

## 2014-08-08 MED ORDER — PAROXETINE HCL 20 MG PO TABS
20.0000 mg | ORAL_TABLET | Freq: Every day | ORAL | Status: DC
  Administered 2014-08-08 – 2014-08-09 (×2): 20 mg via ORAL
  Filled 2014-08-08 (×2): qty 1

## 2014-08-08 MED ORDER — HYDROCODONE-ACETAMINOPHEN 5-325 MG PO TABS
1.0000 | ORAL_TABLET | ORAL | Status: DC | PRN
  Administered 2014-08-09: 1 via ORAL
  Filled 2014-08-08: qty 1

## 2014-08-08 MED ORDER — VALSARTAN-HYDROCHLOROTHIAZIDE 320-25 MG PO TABS: 1.0000 | ORAL_TABLET | Freq: Every day | ORAL | Status: DC

## 2014-08-08 MED ORDER — COUMADIN BOOK
Freq: Once | Status: AC
  Administered 2014-08-08: 09:00:00
  Filled 2014-08-08: qty 1

## 2014-08-08 MED ORDER — SODIUM CHLORIDE 0.9 % IV SOLN: 250.0000 mL | INTRAVENOUS | Status: DC | PRN

## 2014-08-08 MED ORDER — WARFARIN - PHARMACIST DOSING INPATIENT: Freq: Every day | Status: DC

## 2014-08-08 MED ORDER — WARFARIN SODIUM 4 MG PO TABS
4.0000 mg | ORAL_TABLET | Freq: Once | ORAL | Status: AC
  Administered 2014-08-08: 4 mg via ORAL
  Filled 2014-08-08: qty 1

## 2014-08-08 MED ORDER — IRBESARTAN 300 MG PO TABS
300.0000 mg | ORAL_TABLET | Freq: Every day | ORAL | Status: DC
  Administered 2014-08-08 – 2014-08-09 (×2): 300 mg via ORAL
  Filled 2014-08-08 (×2): qty 1

## 2014-08-08 MED ORDER — DORZOLAMIDE HCL-TIMOLOL MAL 2-0.5 % OP SOLN
1.0000 [drp] | Freq: Two times a day (BID) | OPHTHALMIC | Status: DC
  Administered 2014-08-08 – 2014-08-09 (×4): 1 [drp] via OPHTHALMIC
  Filled 2014-08-08: qty 10

## 2014-08-08 MED ORDER — ONDANSETRON HCL 4 MG/2ML IJ SOLN
4.0000 mg | Freq: Four times a day (QID) | INTRAMUSCULAR | Status: DC | PRN
  Administered 2014-08-08: 4 mg via INTRAVENOUS
  Filled 2014-08-08: qty 2

## 2014-08-08 MED ORDER — SODIUM CHLORIDE 0.9 % IJ SOLN: 3.0000 mL | INTRAMUSCULAR | Status: DC | PRN

## 2014-08-08 MED ORDER — SODIUM CHLORIDE 0.9 % IJ SOLN
3.0000 mL | Freq: Two times a day (BID) | INTRAMUSCULAR | Status: DC
  Administered 2014-08-08: 3 mL via INTRAVENOUS

## 2014-08-08 MED ORDER — ENOXAPARIN SODIUM 80 MG/0.8ML ~~LOC~~ SOLN
1.0000 mg/kg | Freq: Once | SUBCUTANEOUS | Status: AC
  Administered 2014-08-08: 65 mg via SUBCUTANEOUS
  Filled 2014-08-08: qty 0.8

## 2014-08-08 NOTE — Progress Notes (Signed)
ANTICOAGULATION CONSULT NOTE - Initial Consult  Pharmacy Consult for warfarin Indication: pulmonary embolism  Allergies  Allergen Reactions  . Codeine     Patient Measurements: Height: 5' 4.5" (163.8 cm) Weight: 141 lb 3.2 oz (64.048 kg) IBW/kg (Calculated) : 55.85   Vital Signs: Temp: 99.6 F (37.6 C) (10/25 0556) Temp Source: Oral (10/25 0556) BP: 113/75 mmHg (10/25 0556) Pulse Rate: 74 (10/25 0556)  Labs:  Recent Labs  08/07/14 2016 08/08/14 0120 08/08/14 0530  HGB 11.3*  --  10.9*  HCT 33.8*  --  33.4*  PLT 249  --  237  APTT  --  30  --   LABPROT  --  14.1 14.1  INR  --  1.08 1.08  CREATININE 0.95  --   --     Estimated Creatinine Clearance: 41.7 ml/min (by C-G formula based on Cr of 0.95).   Medical History: Past Medical History  Diagnosis Date  . CIN III (cervical intraepithelial neoplasia III)     conization  . Osteopenia   . Vitamin D deficiency   . DVT (deep venous thrombosis)   . Hypertension   . Depression   . Rectal cancer 05/2002    s/p resection  . Fibroid     Medications:  Scheduled:  . amLODipine  5 mg Oral Daily  . dorzolamide-timolol  1 drop Both Eyes BID  . enoxaparin (LOVENOX) injection  1 mg/kg Subcutaneous Q12H  . irbesartan  300 mg Oral Daily   And  . hydrochlorothiazide  25 mg Oral Daily  . PARoxetine  20 mg Oral Daily  . sodium chloride  3 mL Intravenous Q12H   Infusions:   PRN: HYDROcodone-acetaminophen  Assessment: 80 y/o F with distant h/o DVT presented to ED with pleuritic CP, admitted with CT-proven pulmonary embolism.  Lovenox 1mg /kg SQ q12h was started in the ED by the physician.  Orders were received this AM to begin warfarin with pharmacy dosing assistance.  Because of patient age, warfarin initiation nomogram recommends starting with a dosage less than 5 mg.  No drug interactions noted that are likely to affect INR response.   Goal of Therapy:  INR 2-3 Adequate Lovenox / warfarin overlap for treatment of  acute pulmonary embolism Monitor platelets by anticoagulation protocol: Yes    Plan:  1.  Warfarin 4 mg PO x 1 today at 1800. 2.  Continue Lovenox 1mg /kg (65 mg) SQ q12h as ordered by M.D.   Could consider switching to 1.5mg /kg (100 mg) SQ q24h as outpatient.  3.  Needs to continue Lovenox for at least 5 days of overlap with warfarin, or until the INR is 2.0 or higher on two consecutive days, whichever is longer. 3.  Follow PT/INR daily. 4.  Follow CBC at least q72h while inpatient on Lovenox. 5.  Follow clinical course daily. 6.  Plan patient teaching regarding warfarin.  Clayburn Pert, PharmD, BCPS Pager: [phone] 08/08/2014  8:03 AM

## 2014-08-08 NOTE — Progress Notes (Signed)
VASCULAR LAB PRELIMINARY  PRELIMINARY  PRELIMINARY  PRELIMINARY  Bilateral lower extremity venous Dopplers completed.    Preliminary report: There is acute DVT noted in the right soleal and peroneal veins.  All other veins appear thrombus free.   Brenn Deziel, RVT 08/08/2014, 5:13 PM

## 2014-08-08 NOTE — Progress Notes (Signed)
Patient ID: Jade Jenkins, female   DOB: 01-28-1931, 80 y.o.   MRN: [id number] [facility] PROGRESS NOTE  Jade Jenkins NID:[id number] DOB: 07/13/1931 DOA: 08/07/2014 PCP: Kandice Hams, MD  Brief narrative: Addendum to admission note done 08/08/2014 80 yo female h/o dvt (20 years ago unprovoked tx with 6 months of coumadin), htn, polypectomy, rectal cancer s/p resection 2003 who presented to [facility] ED 08/07/2014 with pleuritic chest pain for past 5 days prior to this admission. No fevers or shortness of breath. No LE swelling. She was found to have   right lower lobe segmental pulmonary emboli and infiltration in the right lung base possibly representing infarct. Pt was started on Lovenox for anticoagulation.   Assessment/Plan:    Principal Problem:   Pulmonary embolism and infarction  Started full anticoagulation with Lovenox and added coumadin today.  Monitor for bleeding  Follow up LE doppler study.  Stable respiratory status. No complaints of chest pain.  Active Problems:   Hypertension  Resume home meds: Diovan and Norvasc.  DVT Prophylaxis   On full dose anticoagulation with Lovenox and coumadin    Code Status: Full.  Family Communication:  plan of care discussed with the patient Disposition Plan: Home when stable.    IV Access:   Peripheral IV  Procedures and diagnostic studies:   Dg Chest 2 Viewn10/24/2015  : Apparent sclerotic area within the anterior right fifth rib. Cannot exclude sclerotic rib lesion. Consider further evaluation with chest CT.  Bibasilar scarring.    Ct Chest W Contrast 08/07/2014  No focal rib lesion or acute bony abnormality. The density on prior chest x-ray likely is related to the platelike airspace opacity peripherally in the right lower lobe, likely scarring or atelectasis.  Bilobed basilar scar or atelectasis as described above. Trace right pleural effusion. Scattered coronary artery calcifications.    Ct Angio Chest Pe W/cm &/or Wo  Cm 08/08/2014  Positive for right lower lobe segmental pulmonary emboli. Infiltration in the right lung base may represent infarct. Additional infiltration in the left lung base is nonspecific. Stable pulmonary nodules.    Medical Consultants:   None   Other Consultants:   None   Anti-Infectives:   None    Leisa Lenz, MD  [facility] Pager [phone]  If 7PM-7AM, please contact night-coverage [email or web address] Password [email or web address] 08/08/2014, 1:37 PM   LOS: 1 day    HPI/Subjective: No acute overnight events.  Objective: Filed Vitals:   08/08/14 0110 08/08/14 0111 08/08/14 0300 08/08/14 0556  BP: 144/65  151/60 113/75  Pulse: 82  82 74  Temp:   99.9 F (37.7 C) 99.6 F (37.6 C)  TempSrc:    Oral  Resp: 15  20 20   Height:  5' 4.5" (1.638 m) 5' 4.5" (1.638 m)   Weight:  63.957 kg (141 lb) 64.048 kg (141 lb 3.2 oz)   SpO2: 97%  95% 100%    Intake/Output Summary (Last 24 hours) at 08/08/14 1337 Last data filed at 08/08/14 0900  Gross per 24 hour  Intake    240 ml  Output      0 ml  Net    240 ml    Exam:   General:  Pt is alert, follows commands appropriately, not in acute distress  Cardiovascular: Regular rate and rhythm, S1/S2 appreciated   Respiratory: Clear to auscultation bilaterally, no wheezing  Abdomen: Soft, non tender, non distended, bowel sounds present  Extremities: No edema, pulses DP and PT palpable bilaterally  Neuro: Grossly  nonfocal  Data Reviewed: Basic Metabolic Panel:  Recent Labs Lab 08/07/14 2016  NA 140  K 3.9  CL 98  CO2 30  GLUCOSE 121*  BUN 22  CREATININE 0.95  CALCIUM 9.3   Liver Function Tests: No results found for this basename: AST, ALT, ALKPHOS, BILITOT, PROT, ALBUMIN,  in the last 168 hours No results found for this basename: LIPASE, AMYLASE,  in the last 168 hours No results found for this basename: AMMONIA,  in the last 168 hours CBC:  Recent Labs Lab 08/07/14 2016 08/08/14 0530  WBC 8.8 7.1  HGB 11.3*  10.9*  HCT 33.8* 33.4*  MCV 79.9 79.0  PLT 249 237   Cardiac Enzymes: No results found for this basename: CKTOTAL, CKMB, CKMBINDEX, TROPONINI,  in the last 168 hours BNP: No components found with this basename: POCBNP,  CBG: No results found for this basename: GLUCAP,  in the last 168 hours  No results found for this or any previous visit (from the past 240 hour(s)).   Scheduled Meds: . amLODipine  5 mg Oral Daily  . enoxaparin (LOVENOX) injection  1 mg/kg Subcutaneous Q12H  . irbesartan  300 mg Oral Daily   And  . hydrochlorothiazide  25 mg Oral Daily  . PARoxetine  20 mg Oral Daily  . warfarin  4 mg Oral ONCE-1800

## 2014-08-08 NOTE — ED Provider Notes (Signed)
Patient signed out to me from Dr. Mingo Amber pending CT angiogram for evaluation of pulmonary embolism. CT results reveals an acute pulmonary malaise him in the right segmental artery. Patient was given Lovenox emergency department will be retained in the hospitalist service for continued anticoagulation. Patient amenable to this plan. Eval signs remained within her normal limits, there is no tachycardia or hypoxia.  IMPRESSION: Positive for right lower lobe segmental pulmonary emboli. Infiltration in the right lung base may represent infarct. Additional infiltration in the left lung base is nonspecific. Stable pulmonary nodules.  Everlene Balls, MD 08/08/14 [phone]

## 2014-08-08 NOTE — H&P (Signed)
PCP:   Kandice Hams, MD   Chief Complaint:  Chest pain  HPI: 80 yo female h/o dvt (20 years ago unprovoked tx with 6 months of coumadin), htn, polypectomy, rectal cancer s/p resection 2003 comes in with 5 day of pleuritic chest pain on the right side.  She says as long as she does not breath deeply, the pain is not so bad.  Denies any fevers or coughing.  No le edema or swelling.  No coughing blood.  No sob.  She has several family members including a sister and her mother who have had dvt/PE.  She has had no recent traveling, no recent illnesses or surgeries.  Not on any estrogen.  cta shows right sided PE.  Review of Systems:  Positive and negative as per HPI otherwise all other systems are negative  Past Medical History: Past Medical History  Diagnosis Date  . CIN III (cervical intraepithelial neoplasia III)     conization  . Osteopenia   . Vitamin D deficiency   . DVT (deep venous thrombosis)   . Hypertension   . Depression   . Rectal cancer 05/2002    s/p resection  . Fibroid    Past Surgical History  Procedure Laterality Date  . Gynecologic cryosurgery    . Cervical cone biopsy      CIN lll  . Rotator cuff repair    . Tubal ligation    . Vaginal hysterectomy    . Pelvic laparoscopy  2001    BSO  . Colon carcinoid    . Oophorectomy  2001    BSO  . Colposcopy      Medications: Prior to Admission medications   Medication Sig Start Date End Date Taking? Authorizing Provider  B Complex Vitamins (VITAMIN-B COMPLEX PO) Take 1 tablet by mouth daily.    Yes Historical Provider, MD  dorzolamide-timolol (COSOPT) 22.3-6.8 MG/ML ophthalmic solution Place 1 drop into both eyes 2 (two) times daily.   Yes Historical Provider, MD  naproxen sodium (ANAPROX) 220 MG tablet Take 220 mg by mouth daily as needed (pain).   Yes Historical Provider, MD  PARoxetine (PAXIL) 20 MG tablet Take 20 mg by mouth every morning.     Yes Historical Provider, MD  valsartan-hydrochlorothiazide  (DIOVAN-HCT) 320-25 MG per tablet Take 1 tablet by mouth daily.     Yes Historical Provider, MD  amLODipine (NORVASC) 5 MG tablet Take 5 mg by mouth daily.    Historical Provider, MD    Allergies:   Allergies  Allergen Reactions  . Codeine     Social History:  reports that she has quit smoking. She has never used smokeless tobacco. She reports that she does not drink alcohol or use illicit drugs.  Family History: Family History  Problem Relation Age of Onset  . Uterine cancer Mother   . Hypertension Father   . Diabetes Sister   . Hypertension Sister   . Heart disease Sister   . Hypertension Paternal Grandmother   . Hypertension Paternal Grandfather     Physical Exam: Filed Vitals:   08/07/14 1950 08/07/14 2152 08/08/14 0110 08/08/14 0111  BP: 136/60 136/57 144/65   Pulse: 90 86 82   Temp: 98 F (36.7 C)     TempSrc: Oral     Resp: 19 18 15    Height:    5' 4.5" (1.638 m)  Weight:    63.957 kg (141 lb)  SpO2: 97% 99% 97%    General appearance: alert, cooperative and  no distress Head: Normocephalic, without obvious abnormality, atraumatic Eyes: negative Nose: Nares normal. Septum midline. Mucosa normal. No drainage or sinus tenderness. Neck: no JVD and supple, symmetrical, trachea midline Lungs: clear to auscultation bilaterally Heart: regular rate and rhythm, S1, S2 normal, no murmur, click, rub or gallop Abdomen: soft, non-tender; bowel sounds normal; no masses,  no organomegaly Extremities: extremities normal, atraumatic, no cyanosis or edema Pulses: 2+ and symmetric Skin: Skin color, texture, turgor normal. No rashes or lesions Neurologic: Grossly normal  Labs on Admission:   Recent Labs  08/07/14 2016  NA 140  K 3.9  CL 98  CO2 30  GLUCOSE 121*  BUN 22  CREATININE 0.95  CALCIUM 9.3    Recent Labs  08/07/14 2016  WBC 8.8  HGB 11.3*  HCT 33.8*  MCV 79.9  PLT 249   Radiological Exams on Admission: Dg Chest 2 View  08/07/2014   CLINICAL  DATA:  Right-sided chest pain radiating to right back for 2-3 days. Pain worse with deep breath and when moving right arm. Remote history of rectal carcinoma.  EXAM: CHEST  2 VIEW  COMPARISON:  Chest CT 06/15/2010  FINDINGS: Heart is normal size. Linear densities in the lung bases likely reflect scarring. No effusions or fluid airspace opacities.  The anterior right fifth rib appears sclerotic. This is new since prior CT. Cannot exclude focal sclerotic rib lesion/metastasis. Consider further evaluation with chest CT if felt clinically indicated.  IMPRESSION: Apparent sclerotic area within the anterior right fifth rib. Cannot exclude sclerotic rib lesion. Consider further evaluation with chest CT.  Bibasilar scarring.   Electronically Signed   By: Rolm Baptise M.D.   On: 08/07/2014 20:07   Ct Chest W Contrast  08/07/2014   CLINICAL DATA:  Right-sided chest pain radiating to back for 2-3 days. Hurts most with deep breathing and we in moving right arm. Abnormal chest x-ray with possible sclerotic lesion in the right anterior fifth rib earlier today.  EXAM: CT CHEST WITH CONTRAST  TECHNIQUE: Multidetector CT imaging of the chest was performed during intravenous contrast administration.  CONTRAST:  171mL OMNIPAQUE IOHEXOL 300 MG/ML  SOLN  COMPARISON:  Chest x-ray earlier today.  Chest CT 06/15/2010  FINDINGS: There are linear and platelike densities posteriorly in the lower lobes, also noted peripherally and laterally. This likely reflects scarring or atelectasis. Trace right pleural effusion.  Heart is normal size. Aorta is normal caliber. Scattered coronary artery calcifications. No mediastinal, hilar, or axillary adenopathy. Chest wall soft tissues are unremarkable.  No acute bony abnormality or focal bone lesion. In particular, no sclerotic lesion within the anterior right fifth rib as questioned on prior plain film. The density may have represented the platelike airspace disease peripherally in the right lower  lobe. No suspicious bone lesion. Degenerative changes in the mid to lower thoracic spine.  IMPRESSION: No focal rib lesion or acute bony abnormality. The density on prior chest x-ray likely is related to the platelike airspace opacity peripherally in the right lower lobe, likely scarring or atelectasis.  Bilobed basilar scar or atelectasis as described above. Trace right pleural effusion.  Scattered coronary artery calcifications.   Electronically Signed   By: Rolm Baptise M.D.   On: 08/07/2014 22:16   Ct Angio Chest Pe W/cm &/or Wo Cm  08/08/2014   CLINICAL DATA:  Right-sided chest pain for 3 days. Chest pain radiates to the right back. Worse with a deep breath.  EXAM: CT ANGIOGRAPHY CHEST WITH CONTRAST  TECHNIQUE: Multidetector  CT imaging of the chest was performed using the standard protocol during bolus administration of intravenous contrast. Multiplanar CT image reconstructions and MIPs were obtained to evaluate the vascular anatomy.  CONTRAST:  36mL OMNIPAQUE IOHEXOL 350 MG/ML SOLN  COMPARISON:  CT chest 08/07/2014  FINDINGS: Technically adequate study with good opacification of the central and segmental pulmonary arteries. Filling defect is identified in the bifurcation of the right lobar pulmonary artery with extension into posterior right lower lobe branch consistent with focal pulmonary embolus. There is associated infiltration in the right lung base likely indicating parenchymal infarct. No other focal emboli are demonstrated. Nonspecific infiltration or consolidation is however demonstrated in the left lung base which may indicate infarct in this area due to occult embolus. Subpleural nodule in the left lingula measuring 8 mm diameter. This was present on the previous study from 06/15/2010 without change. 3 mm subpleural nodule in the right mid lung is also unchanged. Small right pleural effusion. No pneumothorax.  Normal heart size. Normal caliber thoracic aorta. Included portions of the upper  abdominal organs are grossly unremarkable. Degenerative changes in the spine.  Review of the MIP images confirms the above findings.  IMPRESSION: Positive for right lower lobe segmental pulmonary emboli. Infiltration in the right lung base may represent infarct. Additional infiltration in the left lung base is nonspecific. Stable pulmonary nodules.  These results were called by telephone at the time of interpretation on 08/08/2014 at 12:45 am to Dr. Evelina Bucy , who verbally acknowledged these results.   Electronically Signed   By: Lucienne Capers M.D.   On: 08/08/2014 00:49    Assessment/Plan  80 yo female with right sided pleuritic chest pain with segmental RLL PE  Principal Problem:   Pulmonary embolism and infarction-  Pt will need lifelong anticoagulation at this point, suspect hypercoagulable state with her family history.  i have discussed coumadin vs new agents and she is leaning towards coumadin again but she is really  Not sure.  For now place on full dose lovenox.  vss including normal oxygen sats on RA.  Admit to tele floor.  Her mammogram and colonoscopy screening are all up to date.    Active Problems:  Stable unless o/w noted   H/o DVT (deep venous thrombosis) 20 years ago   Hypertension-  stable   Rectal cancer remission since 2003   Chest pain  Full code.  Jakobee Brackins A 08/08/2014, 1:14 AM

## 2014-08-08 NOTE — Progress Notes (Signed)
UR completed 

## 2014-08-09 DIAGNOSIS — R091 Pleurisy: Secondary | ICD-10-CM | POA: Diagnosis present

## 2014-08-09 DIAGNOSIS — R079 Chest pain, unspecified: Secondary | ICD-10-CM | POA: Diagnosis present

## 2014-08-09 DIAGNOSIS — I10 Essential (primary) hypertension: Secondary | ICD-10-CM | POA: Diagnosis present

## 2014-08-09 LAB — CBC
HEMATOCRIT: 32 % — AB (ref 36.0–46.0)
Hemoglobin: 10.6 g/dL — ABNORMAL LOW (ref 12.0–15.0)
MCH: 26.4 pg (ref 26.0–34.0)
MCHC: 33.1 g/dL (ref 30.0–36.0)
MCV: 79.8 fL (ref 78.0–100.0)
Platelets: 242 10*3/uL (ref 150–400)
RBC: 4.01 MIL/uL (ref 3.87–5.11)
RDW: 14.8 % (ref 11.5–15.5)
WBC: 7.1 10*3/uL (ref 4.0–10.5)

## 2014-08-09 LAB — PROTIME-INR
INR: 1.12 (ref 0.00–1.49)
Prothrombin Time: 14.6 seconds (ref 11.6–15.2)

## 2014-08-09 MED ORDER — WARFARIN SODIUM 4 MG PO TABS: 4.0000 mg | ORAL_TABLET | Freq: Once | ORAL | Status: DC

## 2014-08-09 MED ORDER — WARFARIN SODIUM 4 MG PO TABS
4.0000 mg | ORAL_TABLET | Freq: Once | ORAL | Status: DC
  Filled 2014-08-09: qty 1

## 2014-08-09 MED ORDER — HYDROCODONE-ACETAMINOPHEN 5-325 MG PO TABS: 1.0000 | ORAL_TABLET | Freq: Four times a day (QID) | ORAL | Status: DC | PRN

## 2014-08-09 MED ORDER — ENOXAPARIN SODIUM 40 MG/0.4ML ~~LOC~~ SOLN: 100.0000 mg | SUBCUTANEOUS | Status: DC

## 2014-08-09 NOTE — Discharge Instructions (Signed)
Pulmonary Embolism A pulmonary (lung) embolism (PE) is a blood clot that has traveled to the lung and results in a blockage of blood flow in the affected lung. Most clots come from deep veins in the legs or pelvis. PE is a dangerous and potentially life-threatening condition that can be treated if identified. CAUSES Blood clots form in a vein for different reasons. Usually several things cause blood clots. They include:  The flow of blood slows down.  The inside of the vein is damaged in some way.  The person has a condition that makes the blood clot more easily. RISK FACTORS Some people are more likely than others to develop PE. Risk factors include:   Smoking.  Being overweight (obese).  Sitting or lying still for a long time. This includes long-distance travel, paralysis, or recovery from an illness or surgery. Other factors that increase risk are:   Older age, especially over 75 years of age.  Having a family history of blood clots or if you have already had a blood clot.  Having major or lengthy surgery. This is especially true for surgery on the hip, knee, or belly (abdomen). Hip surgery is particularly high risk.  Having a long, thin tube (catheter) placed inside a vein during a medical procedure.  Breaking a hip or leg.  Having cancer or cancer treatment.  Medicines containing the female hormone estrogen. This includes birth control pills and hormone replacement therapy.  Other circulation or heart problems.  Pregnancy and childbirth.  Hormone changes make the blood clot more easily during pregnancy.  The fetus puts pressure on the veins of the pelvis.  There is a risk of injury to veins during delivery or a caesarean delivery. The risk is highest just after childbirth.  PREVENTION   Exercise the legs regularly. Take a brisk 30 minute walk every day.  Maintain a weight that is appropriate for your height.  Avoid sitting or lying in bed for long periods of  time without moving your legs.  Women, particularly those over the age of 35 years, should consider the risks and benefits of taking estrogen medicines, including birth control pills.  Do not smoke, especially if you take estrogen medicines.  Long-distance travel can increase your risk. You should exercise your legs by walking or pumping the muscles every hour.  Many of the risk factors above relate to situations that exist with hospitalization, either for illness, injury, or elective surgery. Prevention may include medical and nonmedical measures.   Your health care provider will assess you for the need for venous thromboembolism prevention when you are admitted to the hospital. If you are having surgery, your surgeon will assess you the day of or day after surgery.  SYMPTOMS  The symptoms of a PE usually start suddenly and include:  Shortness of breath.  Coughing.  Coughing up blood or blood-tinged mucus.  Chest pain. Pain is often worse with deep breaths.  Rapid heartbeat. DIAGNOSIS  If a PE is suspected, your health care provider will take a medical history and perform a physical exam. Other tests that may be required include:  Blood tests, such as studies of the clotting properties of your blood.  Imaging tests, such as ultrasound, CT, MRI, and other tests to see if you have clots in your legs or lungs.  An electrocardiogram. This can look for heart strain from blood clots in the lungs. TREATMENT   The most common treatment for a PE is blood thinning (anticoagulant) medicine, which reduces   the blood's tendency to clot. Anticoagulants can stop new blood clots from forming and old clots from growing. They cannot dissolve existing clots. Your body does this by itself over time. Anticoagulants can be given by mouth, through an intravenous (IV) tube, or by injection. Your health care provider will determine the best program for you.  Less commonly, clot-dissolving medicines  (thrombolytics) are used to dissolve a PE. They carry a high risk of bleeding, so they are used mainly in severe cases.  Very rarely, a blood clot in the leg needs to be removed surgically.  If you are unable to take anticoagulants, your health care provider may arrange for you to have a filter placed in a main vein in your abdomen. This filter prevents clots from traveling to your lungs. HOME CARE INSTRUCTIONS   Take all medicines as directed by your health care provider.  Learn as much as you can about DVT.  Wear a medical alert bracelet or carry a medical alert card.  Ask your health care provider how soon you can go back to normal activities. It is important to stay active to prevent blood clots. If you are on anticoagulant medicine, avoid contact sports.  It is very important to exercise. This is especially important while traveling, sitting, or standing for long periods of time. Exercise your legs by walking or by tightening and relaxing your leg muscles regularly. Take frequent walks.  You may need to wear compression stockings. These are tight elastic stockings that apply pressure to the lower legs. This pressure can help keep the blood in the legs from clotting. Taking Warfarin Warfarin is a daily medicine that is taken by mouth. Your health care provider will advise you on the length of treatment (usually 3-6 months, sometimes lifelong). If you take warfarin:  Understand how to take warfarin and foods that can affect how warfarin works in your body.  Too much and too little warfarin are both dangerous. Too much warfarin increases the risk of bleeding. Too little warfarin continues to allow the risk for blood clots. Warfarin and Regular Blood Testing While taking warfarin, you will need to have regular blood tests to measure your blood clotting time. These blood tests usually include both the prothrombin time (PT) and international normalized ratio (INR) tests. The PT and INR  results allow your health care provider to adjust your dose of warfarin. It is very important that you have your PT and INR tested as often as directed by your health care provider.  Warfarin and Your Diet Avoid major changes in your diet, or notify your health care provider before changing your diet. Arrange a visit with a registered dietitian to answer your questions. Many foods, especially foods high in vitamin K, can interfere with warfarin and affect the PT and INR results. You should eat a consistent amount of foods high in vitamin K. Foods high in vitamin K include:   Spinach, kale, broccoli, cabbage, collard and turnip greens, Brussels sprouts, peas, cauliflower, seaweed, and parsley.  Beef and pork liver.  Green tea.  Soybean oil. Warfarin with Other Medicines Many medicines can interfere with warfarin and affect the PT and INR results. You must:  Tell your health care provider about any and all medicines, vitamins, and supplements you take, including aspirin and other over-the-counter anti-inflammatory medicines. Be especially cautious with aspirin and anti-inflammatory medicines. Ask your health care provider before taking these.  Do not take or discontinue any prescribed or over-the-counter medicine except on the advice   of your health care provider or pharmacist. Warfarin Side Effects Warfarin can have side effects, such as easy bruising and difficulty stopping bleeding. Ask your health care provider or pharmacist about other side effects of warfarin. You will need to:  Hold pressure over cuts for longer than usual.  Notify your dentist and other health care providers that you are taking warfarin before you undergo any procedures where bleeding may occur. Warfarin with Alcohol and Tobacco   Drinking alcohol frequently can increase the effect of warfarin, leading to excess bleeding. It is best to avoid alcoholic drinks or consume only very small amounts while taking warfarin.  Notify your health care provider if you change your alcohol intake.  Do not use any tobacco products including cigarettes, chewing tobacco, or electronic cigarettes. If you smoke, quit. Ask your health care provider for help with quitting smoking. Alternative Medicines to Warfarin: Factor Xa Inhibitor Medicines  These blood thinning medicines are taken by mouth, usually for several weeks or longer. It is important to take the medicine every single day, at the same time each day.  There are no regular blood tests required when using these medicines.  There are fewer food and drug interactions than with warfarin.  The side effects of this class of medicine is similar to that of warfarin, including excessive bruising or bleeding. Ask your health care provider or pharmacist about other potential side effects. SEEK MEDICAL CARE IF:   You notice a rapid heartbeat.  You feel weaker or more tired than usual.  You feel faint.  You notice increased bruising.  Your symptoms are not getting better in the time expected.  You are having side effects of medicine. SEEK IMMEDIATE MEDICAL CARE IF:   You have chest pain.  You have trouble breathing.  You have new or increased swelling or pain in one leg.  You cough up blood.  You notice blood in vomit, in a bowel movement, or in urine.  You have a fever. Symptoms of PE may represent a serious problem that is an emergency. Do not wait to see if the symptoms will go away. Get medical help right away. Call your local emergency services (911 in the [place]). Do not drive yourself to the hospital. Document Released: 09/28/2000 Document Revised: 02/15/2014 Document Reviewed: 10/12/2013 [employer] Patient Information 2015 [employer]. This information is not intended to replace advice given to you by your health care provider. Make sure you discuss any questions you have with your health care provider.  

## 2014-08-09 NOTE — Discharge Summary (Signed)
Physician Discharge Summary  Jade Jenkins IAX:[id number] DOB: [birth year] DOA: 08/07/2014  PCP: Kandice Hams, MD  Admit date: 08/07/2014 Discharge date: 08/09/2014  Time spent: 24minutes  Recommendations for Outpatient Follow-up:  1. Discharge home with outpatient PCP follow-up  Discharge Diagnoses:  Principal Problem:   Pulmonary embolism and infarction  Active Problems:   Rectal cancer   Chest pain   Pleurisy   Essential hypertension   Right-sided chest pain   Discharge Condition: Fair  Diet recommendation: Low sodium  Filed Weights   08/08/14 0111 08/08/14 0300  Weight: 63.957 kg (141 lb) 64.048 kg (141 lb 3.2 oz)    History of present illness:  80 yo female h/o dvt (20 years ago unprovoked tx with 6 months of coumadin), htn, polypectomy, rectal cancer s/p resection 2003 who presented to [facility] ED 08/07/2014 with pleuritic chest pain for past 5 days prior to this admission. No fevers or shortness of breath. No LE swelling. She was found to have right lower lobe segmental pulmonary emboli and infiltration in the right lung base possibly representing infarct. Pt was started on Lovenox for anticoagulation and admitted to telemetry.    Hospital Course:  Right-sided Pulmonary embolism with infarction Patient started on full dose Lovenox admission and oral Coumadin on day 2 Patient stable on telemetry and right-sided chest pain has improved Doppler of the lower extremity negative for DVT on preliminary exam. Patient was discussed options of anticoagulation including newer  anticoagulants for her PE but she preferred to be on Coumadin. -She will be discharged on oral Coumadin 4 mg daily and replaced with enoxaparin 100 mg daily subcutaneous injections until INR is therapeutic. Instructions provided regarding her illness, monitor for any signs of bleeding and dietary restrictions. -Spoke with her PCP and notified about patient's hospitalization and discharge. Appointment  scheduled for PCP follow-up on 08/13/2014. INR will be monitored during the visit and further dose adjusted  Active Problems:  Hypertension  Stable. Continue home medications  Code Status: Full.  Family Communication: None at bedside Disposition Plan: Home with PCP follow-up     Procedures:  CT angiogram of the chest  Doppler lower extremity  Consultations:  None  Discharge Exam: Filed Vitals:   08/09/14 1410  BP: 117/48  Pulse: 66  Temp: 98.3 F (36.8 C)  Resp: 20    General: Elderly female in no acute distress HEENT: No pallor, moist oral mucosa Chest: Clear to auscultation bilaterally CVS: Normal S1 and S2, no murmurs Abdomen: Soft, nontender, nondistended, bowel sounds present Extremities: Warm, no leg swellings or edema CNS: Alert and oriented   Discharge Instructions You were cared for by a hospitalist during your hospital stay. If you have any questions about your discharge medications or the care you received while you were in the hospital after you are discharged, you can call the unit and asked to speak with the hospitalist on call if the hospitalist that took care of you is not available. Once you are discharged, your primary care physician will handle any further medical issues. Please note that NO REFILLS for any discharge medications will be authorized once you are discharged, as it is imperative that you return to your primary care physician (or establish a relationship with a primary care physician if you do not have one) for your aftercare needs so that they can reassess your need for medications and monitor your lab values.   Current Discharge Medication List    START taking these medications   Details  enoxaparin (  LOVENOX) 40 MG/0.4ML injection Inject 1 mL (100 mg total) into the skin daily. Qty: 7 mL, Refills: 0    HYDROcodone-acetaminophen (NORCO/VICODIN) 5-325 MG per tablet Take 1 tablet by mouth every 6 (six) hours as needed for moderate  pain. Qty: 30 tablet, Refills: 0    warfarin (COUMADIN) 4 MG tablet Take 1 tablet (4 mg total) by mouth one time only at 6 PM. Qty: 30 tablet, Refills: 0      CONTINUE these medications which have NOT CHANGED   Details  B Complex Vitamins (VITAMIN-B COMPLEX PO) Take 1 tablet by mouth daily.     dorzolamide-timolol (COSOPT) 22.3-6.8 MG/ML ophthalmic solution Place 1 drop into both eyes 2 (two) times daily.   Associated Diagnoses: Rectal cancer    naproxen sodium (ANAPROX) 220 MG tablet Take 220 mg by mouth daily as needed (pain).    PARoxetine (PAXIL) 20 MG tablet Take 20 mg by mouth every morning.      valsartan-hydrochlorothiazide (DIOVAN-HCT) 320-25 MG per tablet Take 1 tablet by mouth daily.      amLODipine (NORVASC) 5 MG tablet Take 5 mg by mouth daily.   Associated Diagnoses: Rectal cancer       Allergies  Allergen Reactions  . Codeine    Follow-up Information   Follow up with Kandice Hams, MD On 08/13/2014. (at 2 pm)    Specialty:  Internal Medicine   Contact information:   [address] [place] [address] [phone]        The results of significant diagnostics from this hospitalization (including imaging, microbiology, ancillary and laboratory) are listed below for reference.    Significant Diagnostic Studies: Dg Chest 2 View  08/07/2014   CLINICAL DATA:  Right-sided chest pain radiating to right back for 2-3 days. Pain worse with deep breath and when moving right arm. Remote history of rectal carcinoma.  EXAM: CHEST  2 VIEW  COMPARISON:  Chest CT 06/15/2010  FINDINGS: Heart is normal size. Linear densities in the lung bases likely reflect scarring. No effusions or fluid airspace opacities.  The anterior right fifth rib appears sclerotic. This is new since prior CT. Cannot exclude focal sclerotic rib lesion/metastasis. Consider further evaluation with chest CT if felt clinically indicated.  IMPRESSION: Apparent sclerotic area within the  anterior right fifth rib. Cannot exclude sclerotic rib lesion. Consider further evaluation with chest CT.  Bibasilar scarring.   Electronically Signed   By: Rolm Baptise M.D.   On: 08/07/2014 20:07   Ct Chest W Contrast  08/07/2014   CLINICAL DATA:  Right-sided chest pain radiating to back for 2-3 days. Hurts most with deep breathing and we in moving right arm. Abnormal chest x-ray with possible sclerotic lesion in the right anterior fifth rib earlier today.  EXAM: CT CHEST WITH CONTRAST  TECHNIQUE: Multidetector CT imaging of the chest was performed during intravenous contrast administration.  CONTRAST:  167mL OMNIPAQUE IOHEXOL 300 MG/ML  SOLN  COMPARISON:  Chest x-ray earlier today.  Chest CT 06/15/2010  FINDINGS: There are linear and platelike densities posteriorly in the lower lobes, also noted peripherally and laterally. This likely reflects scarring or atelectasis. Trace right pleural effusion.  Heart is normal size. Aorta is normal caliber. Scattered coronary artery calcifications. No mediastinal, hilar, or axillary adenopathy. Chest wall soft tissues are unremarkable.  No acute bony abnormality or focal bone lesion. In particular, no sclerotic lesion within the anterior right fifth rib as questioned on prior plain film. The density may have  represented the platelike airspace disease peripherally in the right lower lobe. No suspicious bone lesion. Degenerative changes in the mid to lower thoracic spine.  IMPRESSION: No focal rib lesion or acute bony abnormality. The density on prior chest x-ray likely is related to the platelike airspace opacity peripherally in the right lower lobe, likely scarring or atelectasis.  Bilobed basilar scar or atelectasis as described above. Trace right pleural effusion.  Scattered coronary artery calcifications.   Electronically Signed   By: Rolm Baptise M.D.   On: 08/07/2014 22:16   Ct Angio Chest Pe W/cm &/or Wo Cm  08/08/2014   CLINICAL DATA:  Right-sided chest pain  for 3 days. Chest pain radiates to the right back. Worse with a deep breath.  EXAM: CT ANGIOGRAPHY CHEST WITH CONTRAST  TECHNIQUE: Multidetector CT imaging of the chest was performed using the standard protocol during bolus administration of intravenous contrast. Multiplanar CT image reconstructions and MIPs were obtained to evaluate the vascular anatomy.  CONTRAST:  70mL OMNIPAQUE IOHEXOL 350 MG/ML SOLN  COMPARISON:  CT chest 08/07/2014  FINDINGS: Technically adequate study with good opacification of the central and segmental pulmonary arteries. Filling defect is identified in the bifurcation of the right lobar pulmonary artery with extension into posterior right lower lobe branch consistent with focal pulmonary embolus. There is associated infiltration in the right lung base likely indicating parenchymal infarct. No other focal emboli are demonstrated. Nonspecific infiltration or consolidation is however demonstrated in the left lung base which may indicate infarct in this area due to occult embolus. Subpleural nodule in the left lingula measuring 8 mm diameter. This was present on the previous study from 06/15/2010 without change. 3 mm subpleural nodule in the right mid lung is also unchanged. Small right pleural effusion. No pneumothorax.  Normal heart size. Normal caliber thoracic aorta. Included portions of the upper abdominal organs are grossly unremarkable. Degenerative changes in the spine.  Review of the MIP images confirms the above findings.  IMPRESSION: Positive for right lower lobe segmental pulmonary emboli. Infiltration in the right lung base may represent infarct. Additional infiltration in the left lung base is nonspecific. Stable pulmonary nodules.  These results were called by telephone at the time of interpretation on 08/08/2014 at 12:45 am to Dr. Evelina Bucy , who verbally acknowledged these results.   Electronically Signed   By: Lucienne Capers M.D.   On: 08/08/2014 00:49     Microbiology: No results found for this or any previous visit (from the past 240 hour(s)).   Labs: Basic Metabolic Panel:  Recent Labs Lab 08/07/14 2016  NA 140  K 3.9  CL 98  CO2 30  GLUCOSE 121*  BUN 22  CREATININE 0.95  CALCIUM 9.3   Liver Function Tests: No results found for this basename: AST, ALT, ALKPHOS, BILITOT, PROT, ALBUMIN,  in the last 168 hours No results found for this basename: LIPASE, AMYLASE,  in the last 168 hours No results found for this basename: AMMONIA,  in the last 168 hours CBC:  Recent Labs Lab 08/07/14 2016 08/08/14 0530 08/09/14 0430  WBC 8.8 7.1 7.1  HGB 11.3* 10.9* 10.6*  HCT 33.8* 33.4* 32.0*  MCV 79.9 79.0 79.8  PLT 249 237 242   Cardiac Enzymes: No results found for this basename: CKTOTAL, CKMB, CKMBINDEX, TROPONINI,  in the last 168 hours BNP: BNP (last 3 results)  Recent Labs  08/07/14 1953  PROBNP 88.0   CBG: No results found for this basename: GLUCAP,  in the  last 168 hours     Signed:  Louellen Jenkins  [facility] 08/09/2014, 3:28 PM

## 2014-08-09 NOTE — Progress Notes (Signed)
ANTICOAGULATION CONSULT NOTE - Follow up  Pharmacy Consult for warfarin Indication: pulmonary embolism  Allergies  Allergen Reactions  . Codeine     Patient Measurements: Height: 5' 4.5" (163.8 cm) Weight: 141 lb 3.2 oz (64.048 kg) IBW/kg (Calculated) : 55.85   Vital Signs: Temp: 98.4 F (36.9 C) (10/26 0900) Temp Source: Oral (10/26 0900) BP: 117/51 mmHg (10/26 0900) Pulse Rate: 64 (10/26 0900)  Labs:  Recent Labs  08/07/14 2016 08/08/14 0120 08/08/14 0530 08/09/14 0430  HGB 11.3*  --  10.9* 10.6*  HCT 33.8*  --  33.4* 32.0*  PLT 249  --  237 242  APTT  --  30  --   --   LABPROT  --  14.1 14.1 14.6  INR  --  1.08 1.08 1.12  CREATININE 0.95  --   --   --     Estimated Creatinine Clearance: 41.7 ml/min (by C-G formula based on Cr of 0.95).   Medical History: Past Medical History  Diagnosis Date  . CIN III (cervical intraepithelial neoplasia III)     conization  . Osteopenia   . Vitamin D deficiency   . DVT (deep venous thrombosis)   . Hypertension   . Depression   . Rectal cancer 05/2002    s/p resection  . Fibroid     Medications:  Scheduled:  . amLODipine  5 mg Oral Daily  . dorzolamide-timolol  1 drop Both Eyes BID  . enoxaparin (LOVENOX) injection  1 mg/kg Subcutaneous Q12H  . irbesartan  300 mg Oral Daily   And  . hydrochlorothiazide  25 mg Oral Daily  . PARoxetine  20 mg Oral Daily  . sodium chloride  3 mL Intravenous Q12H  . Warfarin - Pharmacist Dosing Inpatient   Does not apply q1800   Infusions:   PRN: HYDROcodone-acetaminophen, ondansetron (ZOFRAN) IV  Assessment (10/26): 80 y/o F with distant h/o DVT presented to ED with pleuritic CP, admitted with CT-proven pulmonary embolism.  Lovenox 1mg /kg SQ q12h was started in the ED by the physician.     Today, 10/26:  INR 1.08 >> 1.12, signs of trending up (below goal of 2-3)  H/H: low, stable   SCr: 0.95, CrCL  ~42 mL/min  Plts: stable WNL   Goal of Therapy:  INR 2-3 Adequate  Lovenox / warfarin overlap for treatment of acute pulmonary embolism Monitor platelets by anticoagulation protocol: Yes    Plan:  1.  Warfarin 4 mg PO x 1 today at 1800. 2.  Continue Lovenox 1mg /kg (65 mg) SQ q12h as ordered by M.D.   Could consider switching to 1.5mg /kg (100 mg) SQ q24h as outpatient.  3. Day #2 VTE overlap, needs to continue Lovenox for at least 5 days of overlap with warfarin, or until the INR is 2.0 or higher on two consecutive days, whichever is longer. 3.  Follow PT/INR daily. 4.  Follow CBC at least q72h while inpatient on Lovenox. 5.  Follow clinical course daily. 6.  Plan patient teaching regarding warfarin.  Enid Skeens, PharmD Candidate  08/09/14  Agree with above plan  Doreene Eland, PharmD, BCPS.   Pager: [phone] 08/09/2014 11:54 AM

## 2014-08-10 NOTE — Progress Notes (Addendum)
Pharmacy - Brief Note  Patient discharged prior to warfarin education being provided in hospital.  She was given coumadin booklet during hospital stay.  I called patient this morning to discuss warfarin and enoxaparin.  She has gotten prescriptions filled for both.  Reviewed signs/symptoms of bleeding, drug interactions, avoiding NSAIDs, directions for taking warfarin, notifying all physicians now on warfarin, using one pharmacy to evaluate drug interactions while on warfarin, dietary instructions re: Vit K containing foods (incl green tea which she enjoys drinking), and INR/coumadin level follow-up with PCP.  Doreene Eland, PharmD, BCPS.   Pager: [phone] 08/10/2014 10:25 AM

## 2014-08-16 ENCOUNTER — Encounter (HOSPITAL_COMMUNITY): Payer: Self-pay | Admitting: Emergency Medicine

## 2014-10-22 ENCOUNTER — Encounter: Payer: Self-pay | Admitting: Gastroenterology

## 2014-11-15 ENCOUNTER — Ambulatory Visit: Payer: Medicare Other | Admitting: Hematology and Oncology

## 2014-11-15 ENCOUNTER — Telehealth: Payer: Self-pay | Admitting: Hematology and Oncology

## 2014-11-15 ENCOUNTER — Other Ambulatory Visit: Payer: Medicare Other

## 2014-11-15 NOTE — Telephone Encounter (Signed)
returned pt call and confirmed that she wanted to cx....done....she will call back to r/s

## 2014-12-28 ENCOUNTER — Telehealth: Payer: Self-pay | Admitting: Hematology and Oncology

## 2014-12-28 NOTE — Telephone Encounter (Signed)
Patient called and confirmed appointment for April. Mailed calendar.

## 2015-01-24 ENCOUNTER — Other Ambulatory Visit: Payer: Self-pay | Admitting: *Deleted

## 2015-01-24 DIAGNOSIS — C20 Malignant neoplasm of rectum: Secondary | ICD-10-CM

## 2015-01-25 ENCOUNTER — Ambulatory Visit (HOSPITAL_BASED_OUTPATIENT_CLINIC_OR_DEPARTMENT_OTHER): Payer: Medicare Other | Admitting: Hematology and Oncology

## 2015-01-25 ENCOUNTER — Encounter: Payer: Self-pay | Admitting: Hematology and Oncology

## 2015-01-25 ENCOUNTER — Other Ambulatory Visit (HOSPITAL_BASED_OUTPATIENT_CLINIC_OR_DEPARTMENT_OTHER): Payer: Self-pay

## 2015-01-25 VITALS — BP 177/74 | HR 80 | Temp 99.0°F | Resp 17 | Ht 64.5 in | Wt 145.1 lb

## 2015-01-25 DIAGNOSIS — I2699 Other pulmonary embolism without acute cor pulmonale: Secondary | ICD-10-CM

## 2015-01-25 DIAGNOSIS — C20 Malignant neoplasm of rectum: Secondary | ICD-10-CM

## 2015-01-25 DIAGNOSIS — I10 Essential (primary) hypertension: Secondary | ICD-10-CM

## 2015-01-25 DIAGNOSIS — Z85040 Personal history of malignant carcinoid tumor of rectum: Secondary | ICD-10-CM | POA: Diagnosis not present

## 2015-01-25 LAB — COMPREHENSIVE METABOLIC PANEL (CC13)
ALBUMIN: 3.6 g/dL (ref 3.5–5.0)
ALT: 14 U/L (ref 0–55)
ANION GAP: 8 meq/L (ref 3–11)
AST: 18 U/L (ref 5–34)
Alkaline Phosphatase: 60 U/L (ref 40–150)
BUN: 14.1 mg/dL (ref 7.0–26.0)
CALCIUM: 9.2 mg/dL (ref 8.4–10.4)
CHLORIDE: 104 meq/L (ref 98–109)
CO2: 32 meq/L — AB (ref 22–29)
CREATININE: 0.8 mg/dL (ref 0.6–1.1)
EGFR: 75 mL/min/{1.73_m2} — ABNORMAL LOW (ref >90)
Glucose: 100 mg/dl (ref 70–140)
POTASSIUM: 3.4 meq/L — AB (ref 3.5–5.1)
Sodium: 144 mEq/L (ref 136–145)
Total Bilirubin: 0.3 mg/dL (ref 0.20–1.20)
Total Protein: 7.2 g/dL (ref 6.4–8.3)

## 2015-01-25 LAB — CBC WITH DIFFERENTIAL/PLATELET
BASO%: 0.3 % (ref 0.0–2.0)
BASOS ABS: 0 10*3/uL (ref 0.0–0.1)
EOS ABS: 0.1 10*3/uL (ref 0.0–0.5)
EOS%: 1.8 % (ref 0.0–7.0)
HEMATOCRIT: 36.2 % (ref 34.8–46.6)
HEMOGLOBIN: 12 g/dL (ref 11.6–15.9)
LYMPH%: 33.9 % (ref 14.0–49.7)
MCH: 27 pg (ref 25.1–34.0)
MCHC: 33.1 g/dL (ref 31.5–36.0)
MCV: 81.3 fL (ref 79.5–101.0)
MONO#: 0.8 10*3/uL (ref 0.1–0.9)
MONO%: 13 % (ref 0.0–14.0)
NEUT%: 51 % (ref 38.4–76.8)
NEUTROS ABS: 3.1 10*3/uL (ref 1.5–6.5)
PLATELETS: 241 10*3/uL (ref 145–400)
RBC: 4.45 10*6/uL (ref 3.70–5.45)
RDW: 15.7 % — ABNORMAL HIGH (ref 11.2–14.5)
WBC: 6.1 10*3/uL (ref 3.9–10.3)
lymph#: 2.1 10*3/uL (ref 0.9–3.3)

## 2015-01-26 NOTE — Progress Notes (Signed)
Jade [facility] OFFICE PROGRESS NOTE  Patient Care Team: Seward Carol, MD as PCP - General (Internal Medicine) Ronald Lobo, MD as Attending Physician (Gastroenterology)  SUMMARY OF ONCOLOGIC HISTORY:  DIAGNOSIS: History of rectal carcinoid tumor status post resection  SUMMARY OF ONCOLOGIC HISTORY: This is a pleasant lady who was found to have rectal carcinoid tumor, found during routine colonoscopy. She had rectal excision in August of 2003. Since then, she had a routine colonoscopy. In March of 2012, repeat colonoscopy was negative. She also had numerous CT scans done in the past which show only hepatic cysts and pulmonary nodules, all were benign   INTERVAL HISTORY: Please see below for problem oriented charting. She denies changes in bowel habits. She was found to have diagnosis of blood clot last year and was placed on chronic anticoagulation therapy, managed by PCP. The patient denies any recent signs or symptoms of bleeding such as spontaneous epistaxis, hematuria or hematochezia.   REVIEW OF SYSTEMS:   Constitutional: Denies fevers, chills or abnormal weight loss Eyes: Denies blurriness of vision Ears, nose, mouth, throat, and face: Denies mucositis or sore throat Respiratory: Denies cough, dyspnea or wheezes Cardiovascular: Denies palpitation, chest discomfort or lower extremity swelling Gastrointestinal:  Denies nausea, heartburn or change in bowel habits Skin: Denies abnormal skin rashes Lymphatics: Denies new lymphadenopathy or easy bruising Neurological:Denies numbness, tingling or new weaknesses Behavioral/Psych: Mood is stable, no new changes  All other systems were reviewed with the patient and are negative.  I have reviewed the past medical history, past surgical history, social history and family history with the patient and they are unchanged from previous note.  ALLERGIES:  is allergic to codeine.  MEDICATIONS:    Medication List       This  list is accurate as of: 01/25/15 11:59 PM.  Always use your most recent med list.               dorzolamide-timolol 22.3-6.8 MG/ML ophthalmic solution  Commonly known as:  COSOPT  Place 1 drop into both eyes 2 (two) times daily.     PARoxetine 20 MG tablet  Commonly known as:  PAXIL  Take 20 mg by mouth every morning.     valsartan-hydrochlorothiazide 320-25 MG per tablet  Commonly known as:  DIOVAN-HCT  Take 1 tablet by mouth daily.     VITAMIN-B COMPLEX PO  Take 1 tablet by mouth daily.     warfarin 4 MG tablet  Commonly known as:  COUMADIN  Take 1 tablet (4 mg total) by mouth one time only at 6 PM.         PHYSICAL EXAMINATION: ECOG PERFORMANCE STATUS: 0 - Asymptomatic  Filed Vitals:   01/25/15 0948  BP: 177/74  Pulse: 80  Temp: 99 F (37.2 C)  Resp: 17   Filed Weights   01/25/15 0948  Weight: 145 lb 1.6 oz (65.817 kg)    GENERAL:alert, no distress and comfortable SKIN: skin color, texture, turgor are normal, no rashes or significant lesions EYES: normal, Conjunctiva are pink and non-injected, sclera clear OROPHARYNX:no exudate, no erythema and lips, buccal mucosa, and tongue normal  NECK: supple, thyroid normal size, non-tender, without nodularity LYMPH:  no palpable lymphadenopathy in the cervical, axillary or inguinal LUNGS: clear to auscultation and percussion with normal breathing effort HEART: regular rate & rhythm and no murmurs and no lower extremity edema ABDOMEN:abdomen soft, non-tender and normal bowel sounds Musculoskeletal:no cyanosis of digits and no clubbing  NEURO: alert & oriented x  3 with fluent speech, no focal motor/sensory deficits  LABORATORY DATA:  I have reviewed the data as listed    Component Value Date/Time   NA 144 01/25/2015 0938   NA 140 08/07/2014 2016   K 3.4* 01/25/2015 0938   K 3.9 08/07/2014 2016   CL 98 08/07/2014 2016   CL 100 11/14/2012 1021   CO2 32* 01/25/2015 0938   CO2 30 08/07/2014 2016   GLUCOSE 100  01/25/2015 0938   GLUCOSE 121* 08/07/2014 2016   GLUCOSE 86 11/14/2012 1021   BUN 14.1 01/25/2015 0938   BUN 22 08/07/2014 2016   CREATININE 0.8 01/25/2015 0938   CREATININE 0.95 08/07/2014 2016   CALCIUM 9.2 01/25/2015 0938   CALCIUM 9.3 08/07/2014 2016   PROT 7.2 01/25/2015 0938   PROT 6.9 11/15/2011 1009   ALBUMIN 3.6 01/25/2015 0938   ALBUMIN 4.1 11/15/2011 1009   AST 18 01/25/2015 0938   AST 20 11/15/2011 1009   ALT 14 01/25/2015 0938   ALT 13 11/15/2011 1009   ALKPHOS 60 01/25/2015 0938   ALKPHOS 50 11/15/2011 1009   BILITOT 0.30 01/25/2015 0938   BILITOT 0.4 11/15/2011 1009   GFRNONAA 55* 08/07/2014 2016   GFRAA 64* 08/07/2014 2016    No results found for: SPEP, UPEP  Lab Results  Component Value Date   WBC 6.1 01/25/2015   NEUTROABS 3.1 01/25/2015   HGB 12.0 01/25/2015   HCT 36.2 01/25/2015   MCV 81.3 01/25/2015   PLT 241 01/25/2015      Chemistry      Component Value Date/Time   NA 144 01/25/2015 0938   NA 140 08/07/2014 2016   K 3.4* 01/25/2015 0938   K 3.9 08/07/2014 2016   CL 98 08/07/2014 2016   CL 100 11/14/2012 1021   CO2 32* 01/25/2015 0938   CO2 30 08/07/2014 2016   BUN 14.1 01/25/2015 0938   BUN 22 08/07/2014 2016   CREATININE 0.8 01/25/2015 0938   CREATININE 0.95 08/07/2014 2016      Component Value Date/Time   CALCIUM 9.2 01/25/2015 0938   CALCIUM 9.3 08/07/2014 2016   ALKPHOS 60 01/25/2015 0938   ALKPHOS 50 11/15/2011 1009   AST 18 01/25/2015 0938   AST 20 11/15/2011 1009   ALT 14 01/25/2015 0938   ALT 13 11/15/2011 1009   BILITOT 0.30 01/25/2015 0938   BILITOT 0.4 11/15/2011 1009      ASSESSMENT & PLAN:  Rectal cancer Clinically, she has no signs of recurrence. She is a long-term cancer survivor. I recommend she keeps her appointment to see gastroenterologist next year for another colonoscopy. At age 67, if she remains cancer free, she can consider discontinuation of surveillance colonoscopy if the next colonoscopy is  clear. I will discharge the patient from the clinic.   Essential hypertension The patient has high blood pressure today but she felt this could be related to anxiety. I will defer to primary care provider for chronic management.   Pulmonary embolism and infarction The patient have recurrent diagnosis of DVT/PE. Goal of therapy is lifelong. She is comfortable on warfarin therapy. I will defer to PCP for chronic management.       All questions were answered. The patient knows to call the clinic with any problems, questions or concerns. No barriers to learning was detected. I spent 15 minutes counseling the patient face to face. The total time spent in the appointment was 20 minutes and more than 50% was on counseling and review  of test results     [facility], Jade Mclin, MD 01/26/2015 9:02 AM

## 2015-01-26 NOTE — Assessment & Plan Note (Signed)
The patient have recurrent diagnosis of DVT/PE. Goal of therapy is lifelong. She is comfortable on warfarin therapy. I will defer to PCP for chronic management.

## 2015-01-26 NOTE — Assessment & Plan Note (Signed)
The patient has high blood pressure today but she felt this could be related to anxiety. I will defer to primary care provider for chronic management.

## 2015-01-26 NOTE — Assessment & Plan Note (Signed)
Clinically, she has no signs of recurrence. She is a long-term cancer survivor. I recommend she keeps her appointment to see gastroenterologist next year for another colonoscopy. At age 81, if she remains cancer free, she can consider discontinuation of surveillance colonoscopy if the next colonoscopy is clear. I will discharge the patient from the clinic.

## 2015-04-25 ENCOUNTER — Encounter: Payer: Self-pay | Admitting: Gynecology

## 2015-09-21 IMAGING — CT CT ANGIO CHEST
1 of 2 series · 19 of 32 positions shown · IV contrast (OMNIPAQUE 300)
Comparison: CT chest 08/07/2014

CLINICAL DATA: Right-sided chest pain for 3 days. Chest pain
radiates to the right back. Worse with a deep breath.

EXAM:
CT ANGIOGRAPHY CHEST WITH CONTRAST
TECHNIQUE: Multidetector CT imaging of the chest was performed using the
standard protocol during bolus administration of intravenous
contrast. Multiplanar CT image reconstructions and MIPs were
obtained to evaluate the vascular anatomy.
CONTRAST:  80mL OMNIPAQUE IOHEXOL 350 MG/ML SOLN

[Series 6: thins for pacs · axial · 0.66mm/px · z∈[-231,-16]mm · 19 of 237 slices shown]
[im 11/237  lung]
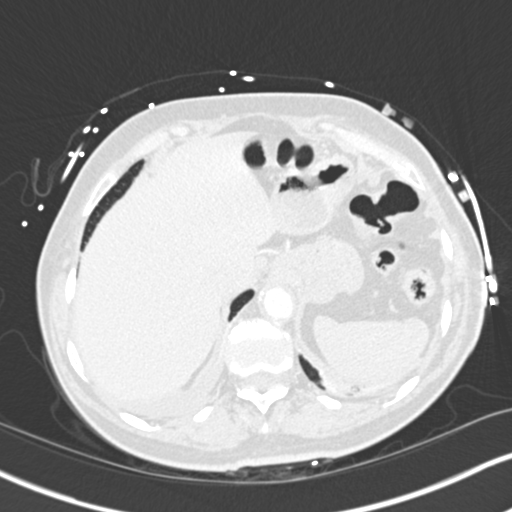
[im 21/237  soft-tissue]
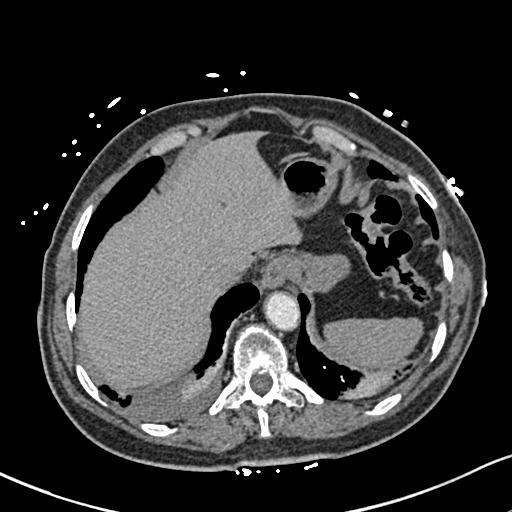
[im 31/237  lung]
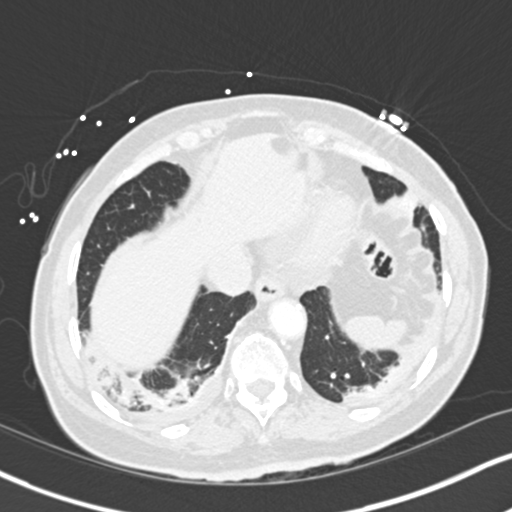
[im 52/237  soft-tissue]
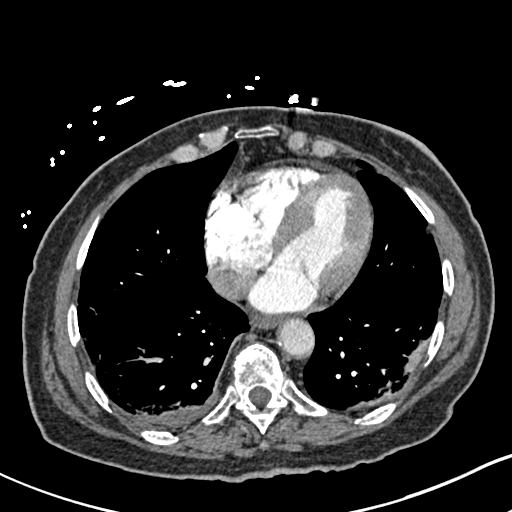
[im 62/237  lung]
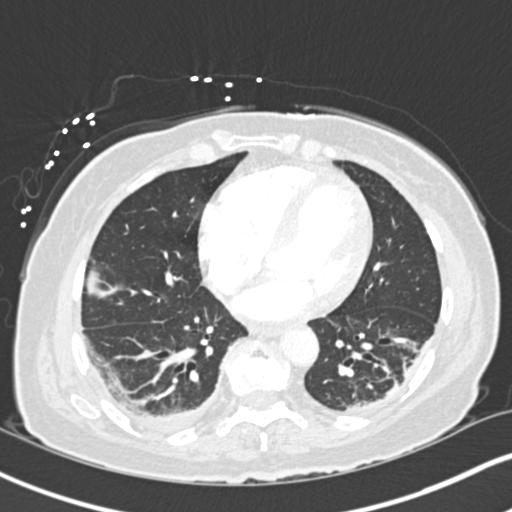
[im 72/237  soft-tissue]
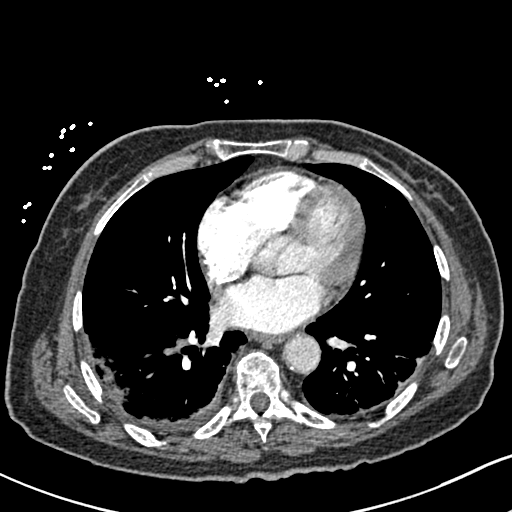
[im 83/237  lung]
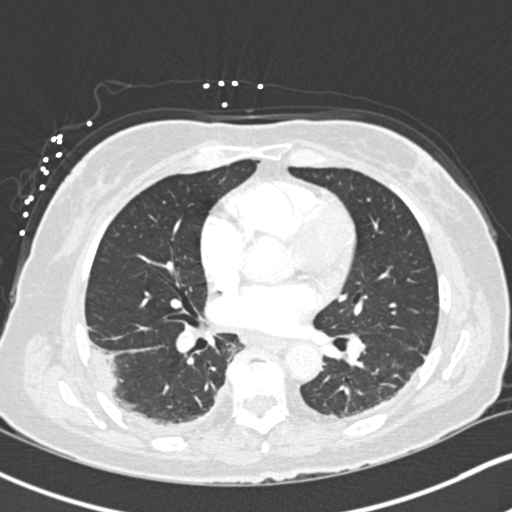
[im 93/237  soft-tissue]
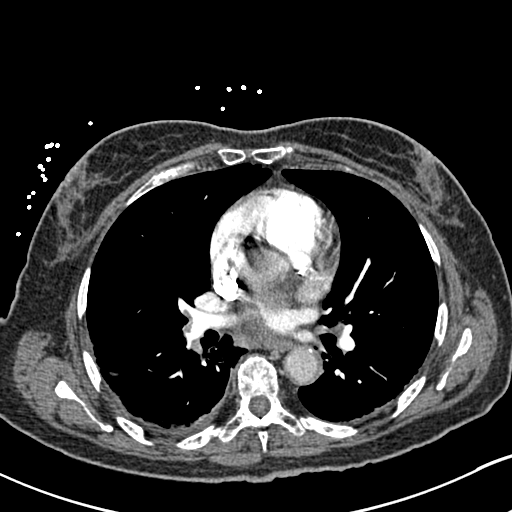
[im 103/237  lung]
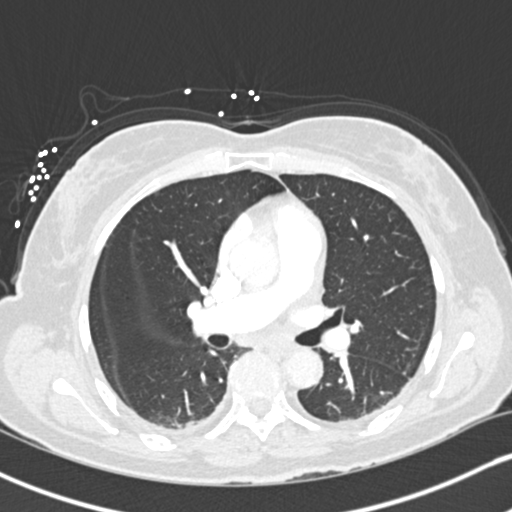
[im 124/237  soft-tissue]
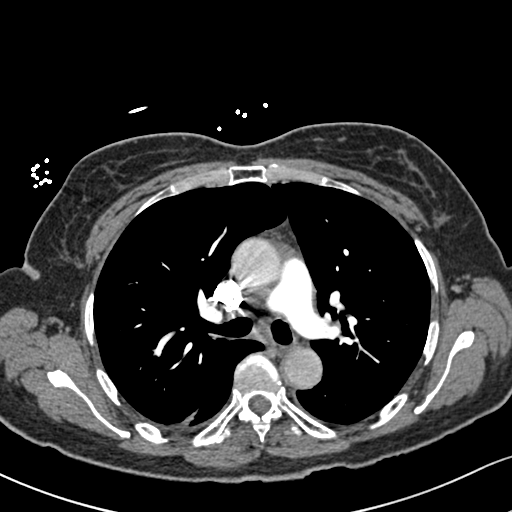
[im 134/237  lung]
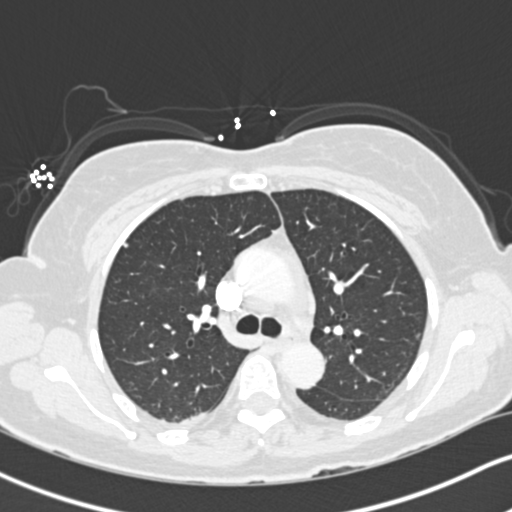
[im 144/237  soft-tissue]
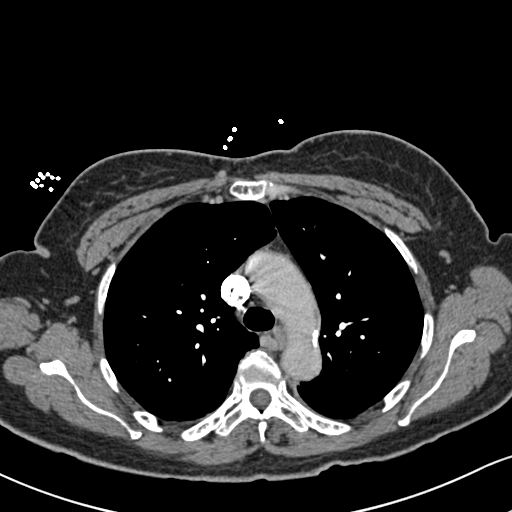
[im 154/237  lung]
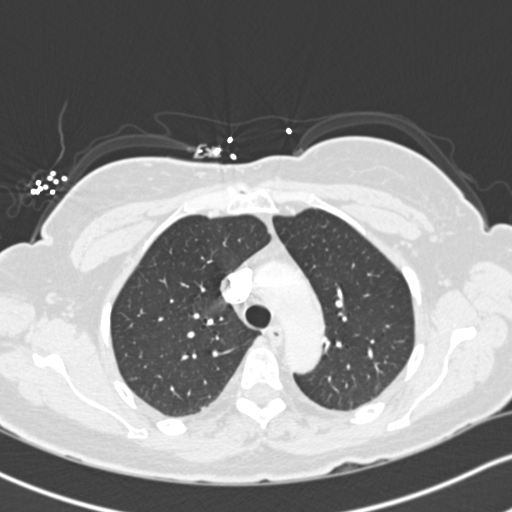
[im 165/237  soft-tissue]
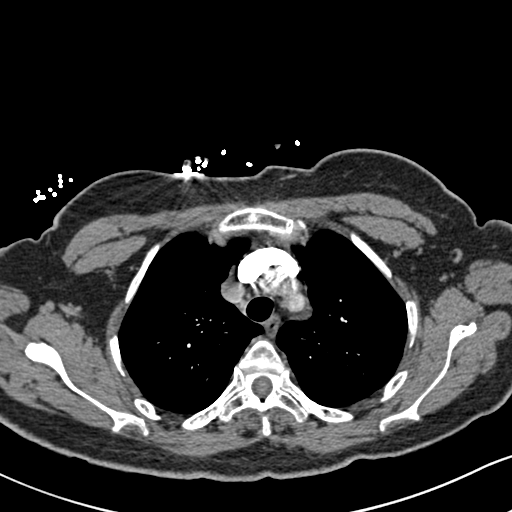
[im 175/237  lung]
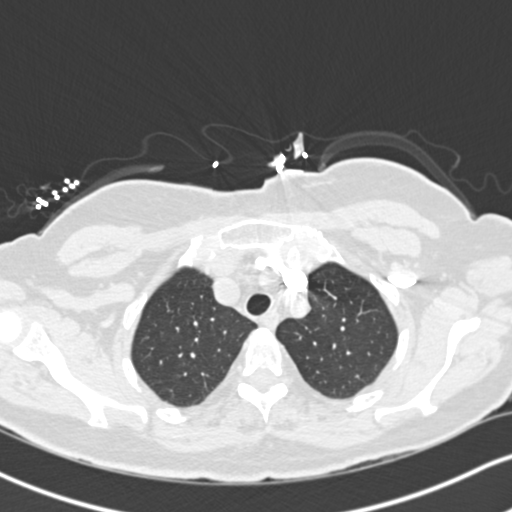
[im 185/237  soft-tissue]
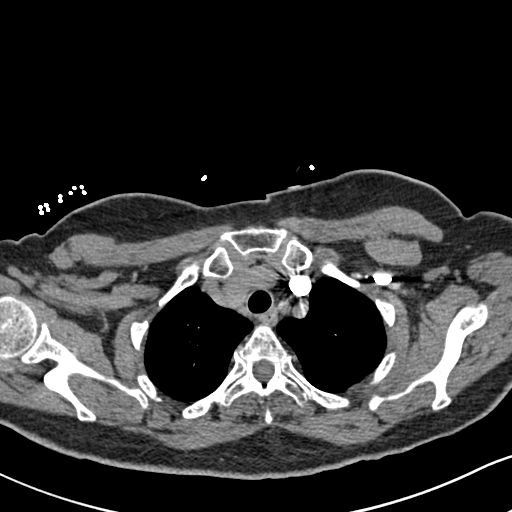
[im 206/237  lung]
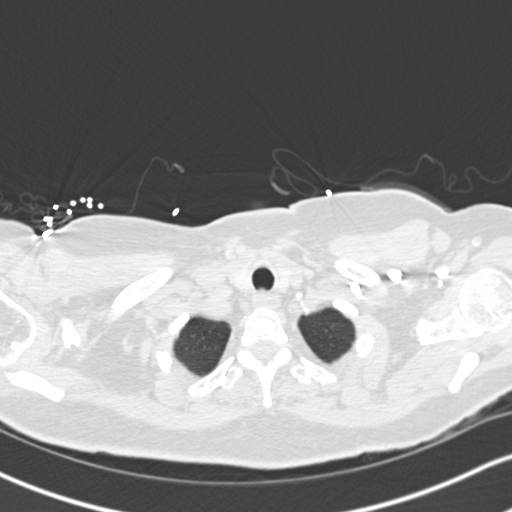
[im 216/237  soft-tissue]
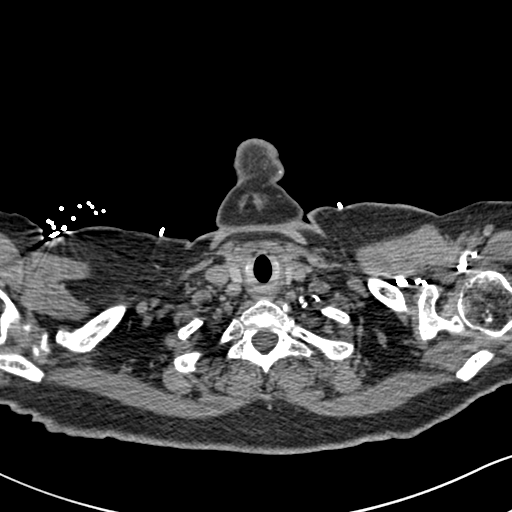
[im 226/237  lung]
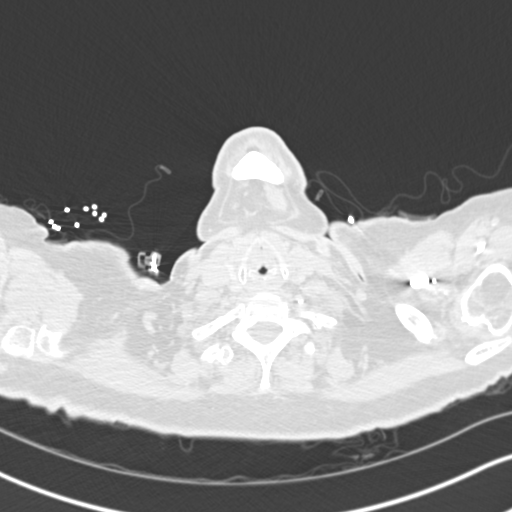

[19 of 32 positions shown; findings below may reference images not displayed]

FINDINGS: Technically adequate study with good opacification of the central
and segmental pulmonary arteries. Filling defect is identified in
the bifurcation of the right lobar pulmonary artery with extension
into posterior right lower lobe branch consistent with focal
pulmonary embolus. There is associated infiltration in the right
lung base likely indicating parenchymal infarct. No other focal
emboli are demonstrated. Nonspecific infiltration or consolidation
is however demonstrated in the left lung base which may indicate
infarct in this area due to occult embolus. Subpleural nodule in the
left lingula measuring 8 mm diameter. This was present on the
previous study from 06/15/2010 without change. 3 mm subpleural
nodule in the right mid lung is also unchanged. Small right pleural
effusion. No pneumothorax.

Normal heart size. Normal caliber thoracic aorta. Included portions
of the upper abdominal organs are grossly unremarkable. Degenerative
changes in the spine.

Review of the MIP images confirms the above findings.
IMPRESSION: Positive for right lower lobe segmental pulmonary emboli.
Infiltration in the right lung base may represent infarct.
Additional infiltration in the left lung base is nonspecific. Stable
pulmonary nodules.

These results were called by telephone at the time of interpretation
on 08/08/2014 at [DATE] to Dr. ASHFAAN NASRIN MONI , who verbally
acknowledged these results.

## 2017-02-27 ENCOUNTER — Encounter: Payer: Self-pay | Admitting: Gynecology

## 2020-06-21 ENCOUNTER — Other Ambulatory Visit: Payer: Self-pay

## 2020-06-21 DIAGNOSIS — Z20822 Contact with and (suspected) exposure to covid-19: Secondary | ICD-10-CM

## 2020-06-22 LAB — NOVEL CORONAVIRUS, NAA: SARS-CoV-2, NAA: NOT DETECTED

## 2020-06-23 ENCOUNTER — Telehealth: Payer: Self-pay | Admitting: General Practice

## 2020-06-23 NOTE — Telephone Encounter (Signed)
Negative COVID results given. Patient results "NOT Detected." Caller expressed understanding. ° °

## 2020-07-05 ENCOUNTER — Ambulatory Visit: Payer: Self-pay | Attending: Internal Medicine

## 2020-07-05 DIAGNOSIS — Z23 Encounter for immunization: Secondary | ICD-10-CM

## 2020-07-05 NOTE — Progress Notes (Signed)
   Covid-19 Vaccination Clinic  Name:  Jade Jenkins    MRN: [id number] DOB: 08/08/1931  07/05/2020  [name] was observed post Covid-19 immunization for 15 minutes without incident. She was provided with Vaccine Information Sheet and instruction to access the V-Safe system.   [name] was instructed to call 911 with any severe reactions post vaccine: Marland Kitchen Difficulty breathing  . Swelling of face and throat  . A fast heartbeat  . A bad rash all over body  . Dizziness and weakness   Immunizations Administered    Name Date Dose VIS Date Route   Pfizer COVID-19 Vaccine 07/05/2020  9:55 AM 0.3 mL 12/09/2018 Intramuscular   Manufacturer: [place]   Lot: [id number]   [place]: [id number]

## 2020-07-26 ENCOUNTER — Ambulatory Visit: Payer: Medicare Other | Attending: Internal Medicine

## 2020-07-26 DIAGNOSIS — Z23 Encounter for immunization: Secondary | ICD-10-CM

## 2020-07-26 NOTE — Progress Notes (Signed)
   Covid-19 Vaccination Clinic  Name:  ADELMA BOWDOIN    MRN: [id number] DOB: 1931/04/16  07/26/2020  Ms. Bodie was observed post Covid-19 immunization for 15 minutes without incident. She was provided with Vaccine Information Sheet and instruction to access the V-Safe system.   Ms. Parrilla was instructed to call 911 with any severe reactions post vaccine: Marland Kitchen Difficulty breathing  . Swelling of face and throat  . A fast heartbeat  . A bad rash all over body  . Dizziness and weakness   Immunizations Administered    Name Date Dose VIS Date Route   Pfizer COVID-19 Vaccine 07/26/2020  9:12 AM 0.3 mL 12/09/2018 Intramuscular   Manufacturer: [employer]   Lot: [id number]   [place]: [id number]

## 2020-10-17 DIAGNOSIS — H401132 Primary open-angle glaucoma, bilateral, moderate stage: Secondary | ICD-10-CM | POA: Diagnosis not present

## 2020-12-25 DIAGNOSIS — B029 Zoster without complications: Secondary | ICD-10-CM | POA: Diagnosis not present

## 2021-04-03 DIAGNOSIS — Z7901 Long term (current) use of anticoagulants: Secondary | ICD-10-CM | POA: Diagnosis not present

## 2021-04-03 DIAGNOSIS — E7800 Pure hypercholesterolemia, unspecified: Secondary | ICD-10-CM | POA: Diagnosis not present

## 2021-04-03 DIAGNOSIS — Z86718 Personal history of other venous thrombosis and embolism: Secondary | ICD-10-CM | POA: Diagnosis not present

## 2021-04-03 DIAGNOSIS — Z86711 Personal history of pulmonary embolism: Secondary | ICD-10-CM | POA: Diagnosis not present

## 2021-04-03 DIAGNOSIS — R300 Dysuria: Secondary | ICD-10-CM | POA: Diagnosis not present

## 2021-05-11 DIAGNOSIS — Z1231 Encounter for screening mammogram for malignant neoplasm of breast: Secondary | ICD-10-CM | POA: Diagnosis not present

## 2021-06-21 NOTE — Progress Notes (Signed)
Patient referred by Seward Carol, MD for palpitations  Subjective:   Jade Jenkins, female    DOB: [birth year], 87 y.o.   MRN: [id number]   Chief Complaint  Patient presents with   Dizziness   New Patient (Initial Visit)     HPI  87 y.o. African-American female with hypertension, h/o DVT/PE (2015), now with complaints of palpitations.  Patient is here with her daughter today.  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  She also stays active with church activities.  For last few months, patient has had multiple deaths in the family which has severely affected her.  Patient is tearful talking about this today.  Over the last couple months, she has noticed complaints of palpitations that she feels in her neck and upper chest.  She denies any specific chest pain, chest tightness or chest discomfort.  Sometimes, she feels occasional pain in her left axilla region.  Blood pressure is elevated today.  At home, blood pressure is much lower than this.  Patient had a seemingly unprovoked PE in 2015.  She has been on long-term anticoagulation since then, currently on Xarelto 10 mg daily.  To my knowledge, she has not seen a hematologist.   Past Medical History:  Diagnosis Date   CIN III (cervical intraepithelial neoplasia III)    conization   Depression    DVT (deep venous thrombosis) (HCC)    Fibroid    Hypertension    Osteopenia    Rectal cancer ([place]) 05/2002   s/p resection   Vitamin D deficiency      Past Surgical History:  Procedure Laterality Date   CERVICAL CONE BIOPSY     CIN lll   Colon carcinoid     COLPOSCOPY     GYNECOLOGIC CRYOSURGERY     OOPHORECTOMY  2001   BSO   PELVIC LAPAROSCOPY  2001   BSO   ROTATOR CUFF REPAIR     TUBAL LIGATION     VAGINAL HYSTERECTOMY       Social History   Tobacco Use  Smoking Status Former  Smokeless Tobacco Never    Social History   Substance and Sexual Activity  Alcohol Use No     Family History   Problem Relation Age of Onset   Uterine cancer Mother    Hypertension Father    Diabetes Sister    Hypertension Sister    Heart disease Sister    Hypertension Paternal Grandmother    Hypertension Paternal Grandfather     Current Outpatient Medications on File Prior to Visit  Medication Sig Dispense Refill   Acetaminophen 500 MG capsule Take by mouth.     ALPRAZolam (XANAX) 0.25 MG tablet Take 0.25 mg by mouth daily as needed.     amLODipine (NORVASC) 5 MG tablet Take 5 mg by mouth daily.     dorzolamide-timolol (COSOPT) 22.3-6.8 MG/ML ophthalmic solution Place 1 drop into both eyes 2 (two) times daily.     hydrochlorothiazide (HYDRODIURIL) 25 MG tablet Take 25 mg by mouth daily.     Multiple Vitamin (MULTI-VITAMIN) tablet Take 1 tablet by mouth daily.     timolol (TIMOPTIC) 0.5 % ophthalmic solution Apply to eye.     XARELTO 10 MG TABS tablet Take 10 mg by mouth daily.     No current facility-administered medications on file prior to visit.    Cardiovascular and other pertinent studies:  EKG 06/22/2021: Sinus rhythm 74 bpm  Normal EKG   Recent  labs: 04/03/2021: Glucose 107, BUN/Cr 18/0.71. EGFR 82. Na/K 141/4.7.  H/H 12.6/38.4. MCV 80.9. Platelets 252 HbA1C N/A Lipid panel N/A TSH N/A    Review of Systems  Cardiovascular:  Positive for palpitations. Negative for chest pain, dyspnea on exertion, leg swelling and syncope.  Psychiatric/Behavioral:  The patient has insomnia.         Vitals:   06/22/21 0855 06/22/21 0856  BP:    Pulse:    Resp:    Temp:    SpO2: 99% 100%   Orthostatic VS for the past 72 hrs (Last 3 readings):  Orthostatic BP Patient Position BP Location Cuff Size Orthostatic Pulse  06/22/21 0856 (!) 195/96 Standing Left Arm Normal 84  06/22/21 0855 (!) 190/100 Sitting Left Arm Normal 80  06/22/21 0853 (!) 188/92 Supine Left Arm Normal 77     Body mass index is 22.14 kg/m. Filed Weights   06/22/21 0840  Weight: 131 lb (59.4 kg)      Objective:   Physical Exam Vitals and nursing note reviewed.  Constitutional:      General: She is not in acute distress. Neck:     Vascular: No JVD.  Cardiovascular:     Rate and Rhythm: Normal rate and regular rhythm.     Pulses: Normal pulses.     Heart sounds: Normal heart sounds. No murmur heard. Pulmonary:     Effort: Pulmonary effort is normal.     Breath sounds: Normal breath sounds. No wheezing or rales.  Musculoskeletal:     Right lower leg: No edema.     Left lower leg: No edema.  Psychiatric:     Comments: Tearful         Assessment & Recommendations:    87 y.o. African-American female with hypertension, h/o DVT/PE (2015), now with complaints of palpitations.  Palpitations: Suspicious for arrhythmias, possible atrial fibrillation.  Recommend 2-week cardiac telemetry. No specific anginal symptoms at this time.  Nonetheless, given her atypical left axilla discomfort, will consider ischemia testing after adequately controlling her blood pressure.  Labile hypertension: Blood pressure 180-190/100 mmHg today.  At home, reportedly runs in 130s over 80s. I suspect either whitecoat hypertension or significant labile hypertension. Patient currently takes amlodipine 5 mg in the morning, and hydrochlorothiazide 25 mg in the evening, in order to avoid having to go to the bathroom during the daytime while she is taking her of a 87 year old lady. I encouraged patient to go ahead and take her amlodipine 5 mg and hydrochlorothiazide 25 mg now.  Repeat blood pressure improving. Starting tomorrow, recommend amlodipine 10 mg in the morning, and hydrochlorothiazide 25 mg the evening. Check echocardiogram  Grief/depression: Patient has had multiple deaths in her family recently.  While grief reaction was normal, I am concerned that this may be progressing to depression.  She is currently on as needed Xanax.  I reckon she will need baseline treatment for possible depression.  I  encouraged her to contact her PCP Dr. Delfina Redwood and seek an earlier appointment to address this.   Thank you for referring the patient to [place]. Please feel free to contact with any questions.   Nigel Mormon, MD Pager: [phone] Office: [phone]

## 2021-06-22 ENCOUNTER — Inpatient Hospital Stay: Payer: Medicare Other

## 2021-06-22 ENCOUNTER — Encounter: Payer: Self-pay | Admitting: Cardiology

## 2021-06-22 ENCOUNTER — Ambulatory Visit: Payer: Medicare Other | Admitting: Cardiology

## 2021-06-22 ENCOUNTER — Other Ambulatory Visit: Payer: Self-pay

## 2021-06-22 VITALS — BP 175/84 | HR 70 | Temp 96.0°F | Resp 16 | Ht 64.5 in | Wt 131.0 lb

## 2021-06-22 DIAGNOSIS — R0989 Other specified symptoms and signs involving the circulatory and respiratory systems: Secondary | ICD-10-CM

## 2021-06-22 DIAGNOSIS — R002 Palpitations: Secondary | ICD-10-CM | POA: Insufficient documentation

## 2021-06-22 DIAGNOSIS — R0600 Dyspnea, unspecified: Secondary | ICD-10-CM | POA: Insufficient documentation

## 2021-06-22 DIAGNOSIS — R0609 Other forms of dyspnea: Secondary | ICD-10-CM | POA: Insufficient documentation

## 2021-06-22 MED ORDER — AMLODIPINE BESYLATE 10 MG PO TABS: 5.0000 mg | ORAL_TABLET | Freq: Every day | ORAL | 3 refills | Status: DC

## 2021-07-07 ENCOUNTER — Ambulatory Visit: Payer: Medicare Other

## 2021-07-07 ENCOUNTER — Other Ambulatory Visit: Payer: Self-pay

## 2021-07-07 DIAGNOSIS — I10 Essential (primary) hypertension: Secondary | ICD-10-CM | POA: Diagnosis not present

## 2021-07-07 DIAGNOSIS — R0989 Other specified symptoms and signs involving the circulatory and respiratory systems: Secondary | ICD-10-CM

## 2021-07-12 DIAGNOSIS — R002 Palpitations: Secondary | ICD-10-CM | POA: Diagnosis not present

## 2021-07-14 ENCOUNTER — Telehealth: Payer: Self-pay | Admitting: Cardiology

## 2021-07-14 NOTE — Progress Notes (Signed)
Called patient, NA, no VM-box to leave a message.

## 2021-07-14 NOTE — Progress Notes (Signed)
Pt aware.

## 2021-07-20 NOTE — Telephone Encounter (Signed)
complete

## 2021-07-24 ENCOUNTER — Other Ambulatory Visit: Payer: Self-pay | Admitting: Cardiology

## 2021-07-24 DIAGNOSIS — R0989 Other specified symptoms and signs involving the circulatory and respiratory systems: Secondary | ICD-10-CM

## 2021-08-10 DIAGNOSIS — R002 Palpitations: Secondary | ICD-10-CM | POA: Diagnosis not present

## 2021-08-11 NOTE — Progress Notes (Signed)
Called pt phone is no in service

## 2021-08-15 NOTE — Progress Notes (Signed)
Called patient, NA, LMAM

## 2021-08-16 NOTE — Progress Notes (Signed)
Called pt and the call got disconnected. Tried to call back but no answer.

## 2021-08-17 NOTE — Progress Notes (Signed)
Pt called back and was given her monitor results.

## 2021-09-26 DIAGNOSIS — I10 Essential (primary) hypertension: Secondary | ICD-10-CM | POA: Diagnosis not present

## 2021-09-26 DIAGNOSIS — Z0000 Encounter for general adult medical examination without abnormal findings: Secondary | ICD-10-CM | POA: Diagnosis not present

## 2021-09-26 DIAGNOSIS — Z86711 Personal history of pulmonary embolism: Secondary | ICD-10-CM | POA: Diagnosis not present

## 2021-09-26 DIAGNOSIS — R946 Abnormal results of thyroid function studies: Secondary | ICD-10-CM | POA: Diagnosis not present

## 2021-09-26 DIAGNOSIS — Z1389 Encounter for screening for other disorder: Secondary | ICD-10-CM | POA: Diagnosis not present

## 2021-09-26 DIAGNOSIS — E7800 Pure hypercholesterolemia, unspecified: Secondary | ICD-10-CM | POA: Diagnosis not present

## 2021-09-26 DIAGNOSIS — Z7901 Long term (current) use of anticoagulants: Secondary | ICD-10-CM | POA: Diagnosis not present

## 2021-09-26 DIAGNOSIS — Z86718 Personal history of other venous thrombosis and embolism: Secondary | ICD-10-CM | POA: Diagnosis not present

## 2021-10-10 DIAGNOSIS — R946 Abnormal results of thyroid function studies: Secondary | ICD-10-CM | POA: Diagnosis not present

## 2021-11-07 DIAGNOSIS — E039 Hypothyroidism, unspecified: Secondary | ICD-10-CM | POA: Diagnosis not present

## 2021-11-21 DIAGNOSIS — H1131 Conjunctival hemorrhage, right eye: Secondary | ICD-10-CM | POA: Diagnosis not present

## 2021-11-23 DIAGNOSIS — H1131 Conjunctival hemorrhage, right eye: Secondary | ICD-10-CM | POA: Diagnosis not present

## 2021-11-23 DIAGNOSIS — I10 Essential (primary) hypertension: Secondary | ICD-10-CM | POA: Diagnosis not present

## 2021-12-12 DIAGNOSIS — E039 Hypothyroidism, unspecified: Secondary | ICD-10-CM | POA: Diagnosis not present

## 2022-01-22 DIAGNOSIS — E039 Hypothyroidism, unspecified: Secondary | ICD-10-CM | POA: Diagnosis not present

## 2022-03-04 ENCOUNTER — Other Ambulatory Visit: Payer: Self-pay | Admitting: Cardiology

## 2022-03-04 DIAGNOSIS — R0989 Other specified symptoms and signs involving the circulatory and respiratory systems: Secondary | ICD-10-CM

## 2022-03-27 DIAGNOSIS — Z86711 Personal history of pulmonary embolism: Secondary | ICD-10-CM | POA: Diagnosis not present

## 2022-03-27 DIAGNOSIS — E039 Hypothyroidism, unspecified: Secondary | ICD-10-CM | POA: Diagnosis not present

## 2022-03-27 DIAGNOSIS — I10 Essential (primary) hypertension: Secondary | ICD-10-CM | POA: Diagnosis not present

## 2022-03-27 DIAGNOSIS — D649 Anemia, unspecified: Secondary | ICD-10-CM | POA: Diagnosis not present

## 2022-03-27 DIAGNOSIS — Z86718 Personal history of other venous thrombosis and embolism: Secondary | ICD-10-CM | POA: Diagnosis not present

## 2022-03-27 DIAGNOSIS — H409 Unspecified glaucoma: Secondary | ICD-10-CM | POA: Diagnosis not present

## 2022-04-10 DIAGNOSIS — D649 Anemia, unspecified: Secondary | ICD-10-CM | POA: Diagnosis not present

## 2022-05-15 ENCOUNTER — Ambulatory Visit: Payer: Self-pay | Admitting: Licensed Clinical Social Worker

## 2022-05-15 NOTE — Patient Outreach (Signed)
  Care Coordination  Unsuccessful outreach   Visit Note   05/15/2022 Name: Jade Jenkins MRN: [id number] DOB: November 12, 1930  Jade Jenkins is a 88 y.o. year old female who sees Seward Carol, MD for primary care.   LCSW reached out to patient today by phone to introduce self, assess needs and offer Care Coordination Services.    Telephone outreach was unsuccessful. Unable to leave a HIPPA compliant phone message due to voice mail not set up.     Goals Addressed   None    SDOH assessments and interventions completed:   No   Care Coordination Interventions Activated:  No Care Coordination Interventions:  No, not indicated  Follow up plan:  Will call patient again in 7 to 10  days  Encounter Outcome:  No Answer  Casimer Lanius, [place] [phone]

## 2022-08-10 DIAGNOSIS — Z1231 Encounter for screening mammogram for malignant neoplasm of breast: Secondary | ICD-10-CM | POA: Diagnosis not present

## 2022-09-27 DIAGNOSIS — Z0000 Encounter for general adult medical examination without abnormal findings: Secondary | ICD-10-CM | POA: Diagnosis not present

## 2022-09-27 DIAGNOSIS — Z86711 Personal history of pulmonary embolism: Secondary | ICD-10-CM | POA: Diagnosis not present

## 2022-09-27 DIAGNOSIS — E039 Hypothyroidism, unspecified: Secondary | ICD-10-CM | POA: Diagnosis not present

## 2022-09-27 DIAGNOSIS — I10 Essential (primary) hypertension: Secondary | ICD-10-CM | POA: Diagnosis not present

## 2022-09-27 DIAGNOSIS — D6869 Other thrombophilia: Secondary | ICD-10-CM | POA: Diagnosis not present

## 2022-11-01 ENCOUNTER — Other Ambulatory Visit: Payer: Self-pay | Admitting: Cardiology

## 2022-11-01 DIAGNOSIS — R0989 Other specified symptoms and signs involving the circulatory and respiratory systems: Secondary | ICD-10-CM

## 2022-12-20 DIAGNOSIS — J029 Acute pharyngitis, unspecified: Secondary | ICD-10-CM | POA: Diagnosis not present

## 2023-03-08 ENCOUNTER — Ambulatory Visit: Payer: Medicare Other | Admitting: Cardiology

## 2023-04-02 DIAGNOSIS — E039 Hypothyroidism, unspecified: Secondary | ICD-10-CM | POA: Diagnosis not present

## 2023-04-02 DIAGNOSIS — Z86718 Personal history of other venous thrombosis and embolism: Secondary | ICD-10-CM | POA: Diagnosis not present

## 2023-04-02 DIAGNOSIS — D6869 Other thrombophilia: Secondary | ICD-10-CM | POA: Diagnosis not present

## 2023-04-02 DIAGNOSIS — H409 Unspecified glaucoma: Secondary | ICD-10-CM | POA: Diagnosis not present

## 2023-04-02 DIAGNOSIS — Z86711 Personal history of pulmonary embolism: Secondary | ICD-10-CM | POA: Diagnosis not present

## 2023-04-02 DIAGNOSIS — I10 Essential (primary) hypertension: Secondary | ICD-10-CM | POA: Diagnosis not present

## 2023-04-02 DIAGNOSIS — E7800 Pure hypercholesterolemia, unspecified: Secondary | ICD-10-CM | POA: Diagnosis not present

## 2023-06-18 ENCOUNTER — Other Ambulatory Visit: Payer: Self-pay | Admitting: Cardiology

## 2023-06-18 DIAGNOSIS — R0989 Other specified symptoms and signs involving the circulatory and respiratory systems: Secondary | ICD-10-CM

## 2023-08-12 DIAGNOSIS — Z1231 Encounter for screening mammogram for malignant neoplasm of breast: Secondary | ICD-10-CM | POA: Diagnosis not present

## 2023-10-04 DIAGNOSIS — E039 Hypothyroidism, unspecified: Secondary | ICD-10-CM | POA: Diagnosis not present

## 2023-10-04 DIAGNOSIS — E7800 Pure hypercholesterolemia, unspecified: Secondary | ICD-10-CM | POA: Diagnosis not present

## 2023-10-04 DIAGNOSIS — I10 Essential (primary) hypertension: Secondary | ICD-10-CM | POA: Diagnosis not present

## 2023-10-04 DIAGNOSIS — Z0000 Encounter for general adult medical examination without abnormal findings: Secondary | ICD-10-CM | POA: Diagnosis not present

## 2023-10-04 DIAGNOSIS — D6869 Other thrombophilia: Secondary | ICD-10-CM | POA: Diagnosis not present

## 2023-10-04 DIAGNOSIS — H409 Unspecified glaucoma: Secondary | ICD-10-CM | POA: Diagnosis not present

## 2023-10-04 DIAGNOSIS — Z86718 Personal history of other venous thrombosis and embolism: Secondary | ICD-10-CM | POA: Diagnosis not present

## 2023-10-04 DIAGNOSIS — Z86711 Personal history of pulmonary embolism: Secondary | ICD-10-CM | POA: Diagnosis not present

## 2023-12-31 DIAGNOSIS — I10 Essential (primary) hypertension: Secondary | ICD-10-CM | POA: Diagnosis not present

## 2023-12-31 DIAGNOSIS — E559 Vitamin D deficiency, unspecified: Secondary | ICD-10-CM | POA: Diagnosis not present

## 2023-12-31 DIAGNOSIS — H409 Unspecified glaucoma: Secondary | ICD-10-CM | POA: Diagnosis not present

## 2023-12-31 DIAGNOSIS — Z136 Encounter for screening for cardiovascular disorders: Secondary | ICD-10-CM | POA: Diagnosis not present

## 2023-12-31 DIAGNOSIS — D6869 Other thrombophilia: Secondary | ICD-10-CM | POA: Diagnosis not present

## 2023-12-31 DIAGNOSIS — Z1159 Encounter for screening for other viral diseases: Secondary | ICD-10-CM | POA: Diagnosis not present

## 2023-12-31 DIAGNOSIS — E039 Hypothyroidism, unspecified: Secondary | ICD-10-CM | POA: Diagnosis not present

## 2023-12-31 DIAGNOSIS — Z79899 Other long term (current) drug therapy: Secondary | ICD-10-CM | POA: Diagnosis not present

## 2023-12-31 DIAGNOSIS — Z0189 Encounter for other specified special examinations: Secondary | ICD-10-CM | POA: Diagnosis not present

## 2023-12-31 DIAGNOSIS — M8580 Other specified disorders of bone density and structure, unspecified site: Secondary | ICD-10-CM | POA: Diagnosis not present

## 2024-01-07 DIAGNOSIS — E039 Hypothyroidism, unspecified: Secondary | ICD-10-CM | POA: Diagnosis not present

## 2024-01-07 DIAGNOSIS — Z7189 Other specified counseling: Secondary | ICD-10-CM | POA: Diagnosis not present

## 2024-01-07 DIAGNOSIS — I509 Heart failure, unspecified: Secondary | ICD-10-CM | POA: Diagnosis not present

## 2024-01-07 DIAGNOSIS — E785 Hyperlipidemia, unspecified: Secondary | ICD-10-CM | POA: Diagnosis not present

## 2024-01-07 DIAGNOSIS — Z86711 Personal history of pulmonary embolism: Secondary | ICD-10-CM | POA: Diagnosis not present

## 2024-01-07 DIAGNOSIS — Z2820 Immunization not carried out because of patient decision for unspecified reason: Secondary | ICD-10-CM | POA: Diagnosis not present

## 2024-01-07 DIAGNOSIS — Z0001 Encounter for general adult medical examination with abnormal findings: Secondary | ICD-10-CM | POA: Diagnosis not present
